# Patient Record
Sex: Female | Born: 1960 | Race: White | Hispanic: No | Marital: Married | Smoking: Former smoker
Health system: Southern US, Community
[De-identification: ages and names within clinical notes are randomized; demographics above are authoritative.]

## PROBLEM LIST (undated history)

## (undated) DIAGNOSIS — I499 Cardiac arrhythmia, unspecified: Secondary | ICD-10-CM

## (undated) DIAGNOSIS — U071 COVID-19: Secondary | ICD-10-CM

## (undated) DIAGNOSIS — T7840XA Allergy, unspecified, initial encounter: Secondary | ICD-10-CM

## (undated) DIAGNOSIS — K219 Gastro-esophageal reflux disease without esophagitis: Secondary | ICD-10-CM

## (undated) DIAGNOSIS — I441 Atrioventricular block, second degree: Secondary | ICD-10-CM

## (undated) DIAGNOSIS — E785 Hyperlipidemia, unspecified: Secondary | ICD-10-CM

## (undated) DIAGNOSIS — I495 Sick sinus syndrome: Secondary | ICD-10-CM

## (undated) DIAGNOSIS — H019 Unspecified inflammation of eyelid: Secondary | ICD-10-CM

## (undated) DIAGNOSIS — R5383 Other fatigue: Secondary | ICD-10-CM

## (undated) DIAGNOSIS — F419 Anxiety disorder, unspecified: Secondary | ICD-10-CM

## (undated) DIAGNOSIS — E538 Deficiency of other specified B group vitamins: Secondary | ICD-10-CM

## (undated) HISTORY — DX: Gastro-esophageal reflux disease without esophagitis: K21.9

## (undated) HISTORY — DX: Other fatigue: R53.83

## (undated) HISTORY — DX: Anxiety disorder, unspecified: F41.9

## (undated) HISTORY — DX: Unspecified inflammation of eyelid: H01.9

## (undated) HISTORY — DX: Hyperlipidemia, unspecified: E78.5

## (undated) HISTORY — DX: Deficiency of other specified B group vitamins: E53.8

## (undated) HISTORY — DX: Cardiac arrhythmia, unspecified: I49.9

## (undated) HISTORY — DX: Allergy, unspecified, initial encounter: T78.40XA

## (undated) HISTORY — DX: Sick sinus syndrome: I49.5

## (undated) HISTORY — DX: COVID-19: U07.1

## (undated) HISTORY — DX: Atrioventricular block, second degree: I44.1

---

## 2001-07-27 HISTORY — PX: CHOLECYSTECTOMY: SHX55

## 2003-07-28 HISTORY — PX: ABDOMINAL HYSTERECTOMY: SHX81

## 2004-06-24 ENCOUNTER — Inpatient Hospital Stay: Payer: Self-pay | Admitting: Obstetrics & Gynecology

## 2006-06-03 ENCOUNTER — Ambulatory Visit: Payer: Self-pay | Admitting: Obstetrics and Gynecology

## 2006-06-07 ENCOUNTER — Ambulatory Visit: Payer: Self-pay | Admitting: Gastroenterology

## 2007-08-09 ENCOUNTER — Ambulatory Visit: Payer: Self-pay

## 2009-03-15 ENCOUNTER — Ambulatory Visit: Payer: Self-pay

## 2010-08-26 ENCOUNTER — Ambulatory Visit: Payer: Self-pay | Admitting: General Practice

## 2010-10-14 ENCOUNTER — Ambulatory Visit: Payer: Self-pay | Admitting: Surgery

## 2011-03-01 ENCOUNTER — Emergency Department: Payer: Self-pay | Admitting: Internal Medicine

## 2011-03-03 ENCOUNTER — Other Ambulatory Visit: Payer: Self-pay | Admitting: Family Medicine

## 2011-08-21 IMAGING — CT CT CERVICAL SPINE WITHOUT CONTRAST
1 series · 12 of 14 positions shown, 15 images · non-contrast
Comparison: none

REASON FOR EXAM: neck pain
COMMENTS:

PROCEDURE:     CT  - CT CERVICAL SPINE WO  - March 01, 2011 [DATE]
RESULT:     Comparison: None.
TECHNIQUE: Multiple axial CT images were obtained of the cervical spine,
without intravenous contrast.  Sagittal and coronal reformatted images were
constructed.

[Series 6: axial · axial · 0.33mm/px · z∈[-658,-504]mm · 12 of 91 slices shown, 15 images]
[im 7/91  soft-tissue]
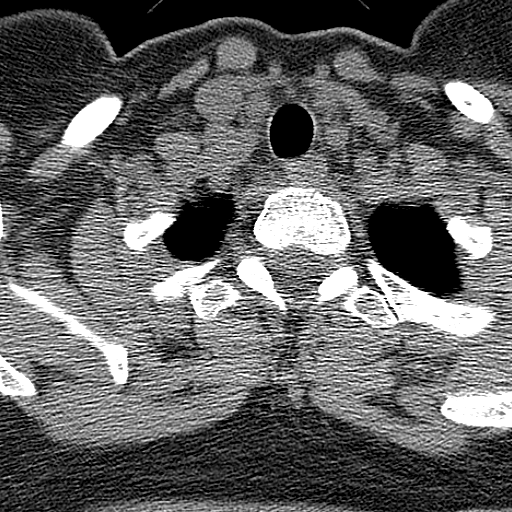
[im 7/91  bone]
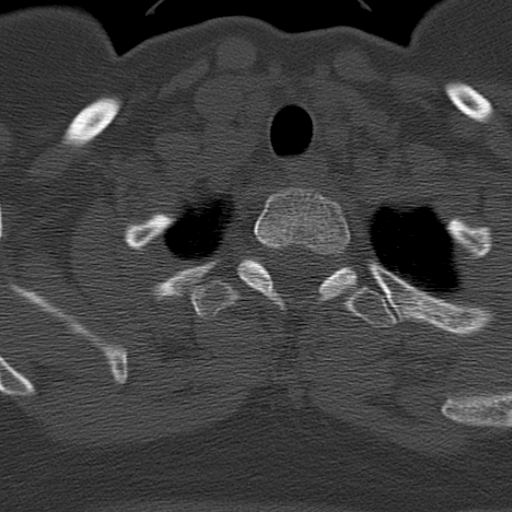
[im 14/91  bone]
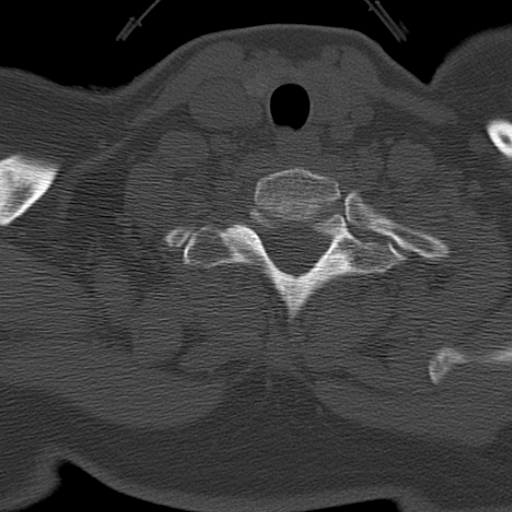
[im 21/91  bone]
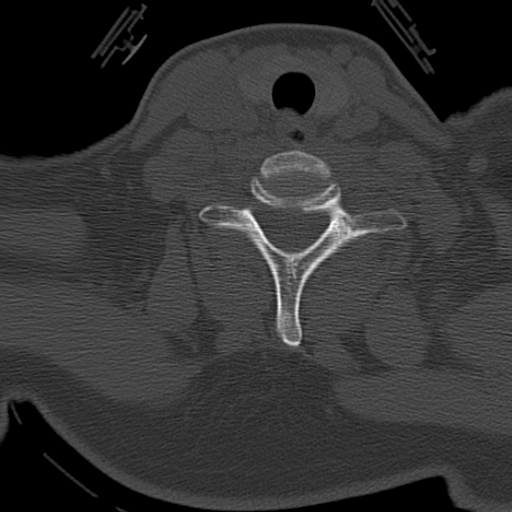
[im 28/91  bone]
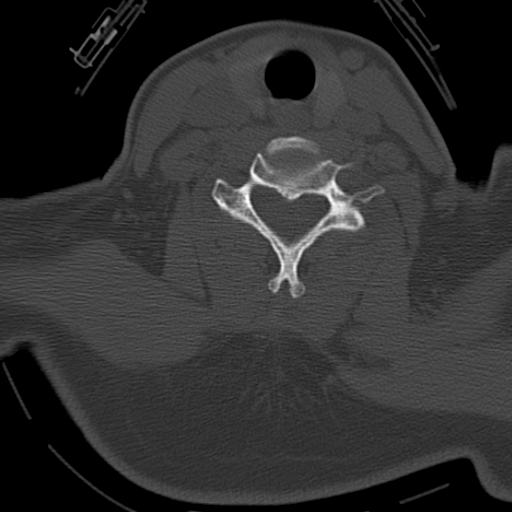
[im 35/91  soft-tissue]
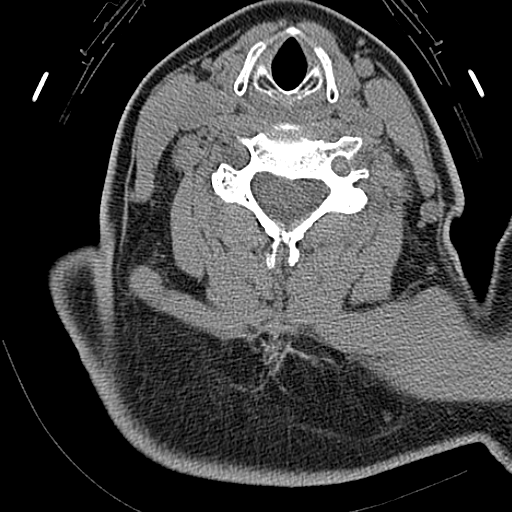
[im 35/91  bone]
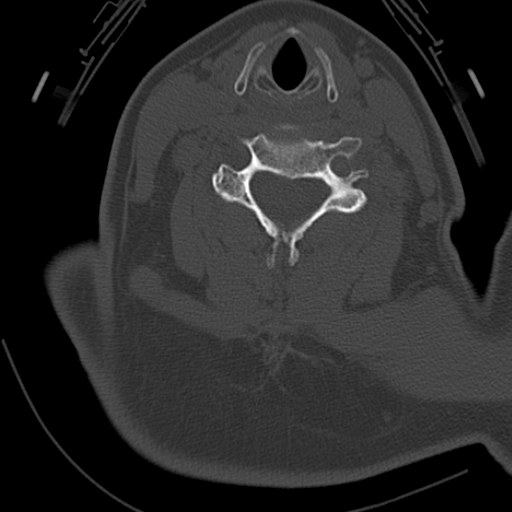
[im 42/91  bone]
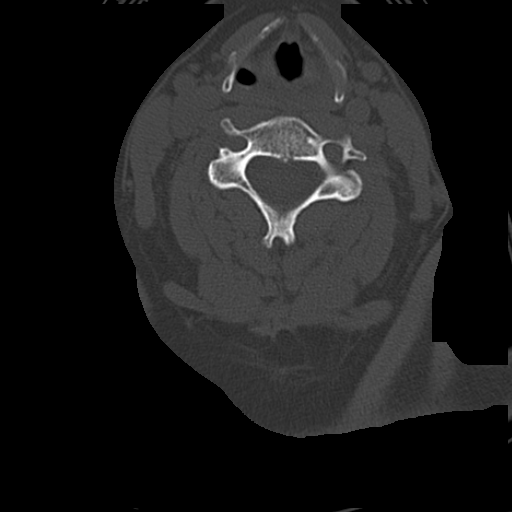
[im 49/91  bone]
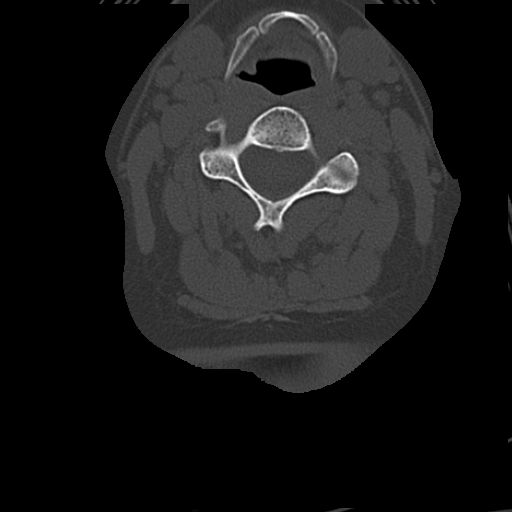
[im 56/91  bone]
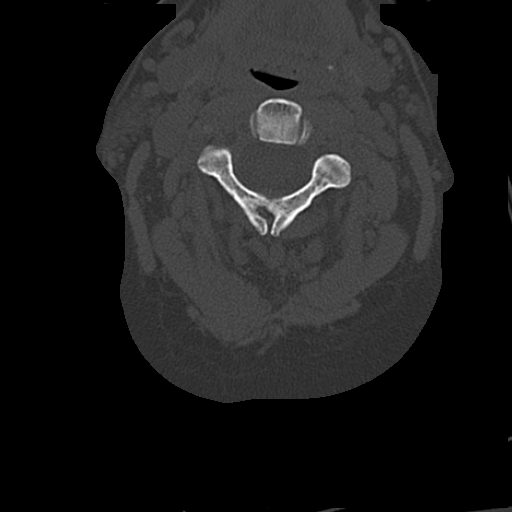
[im 63/91  soft-tissue]
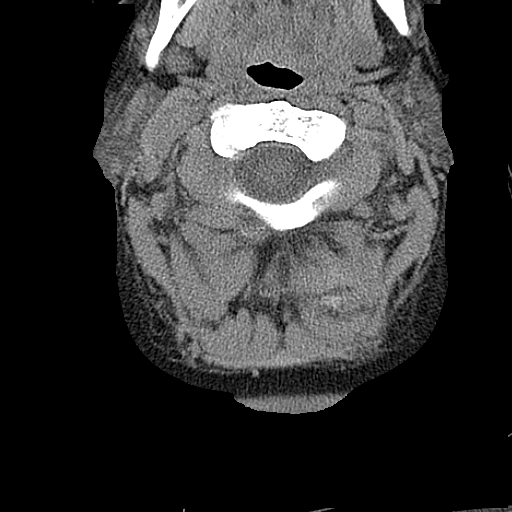
[im 63/91  bone]
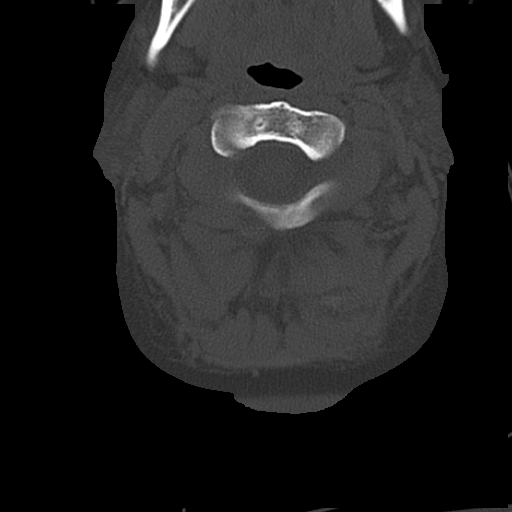
[im 70/91  bone]
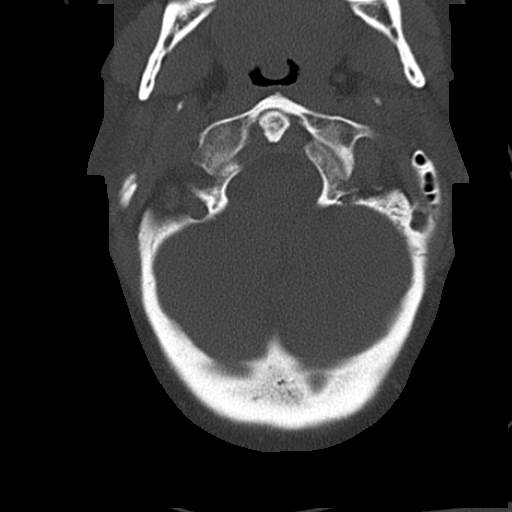
[im 77/91  bone]
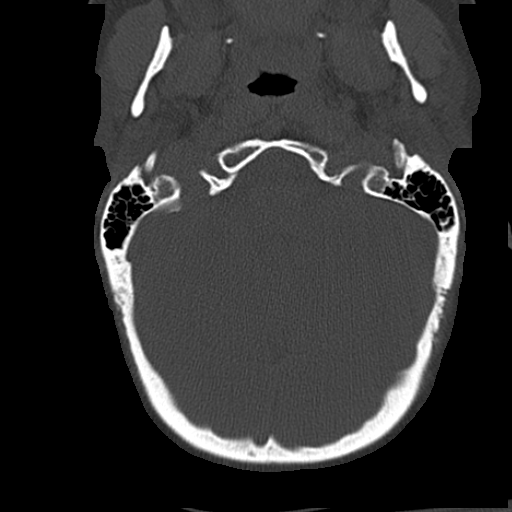
[im 84/91  bone]
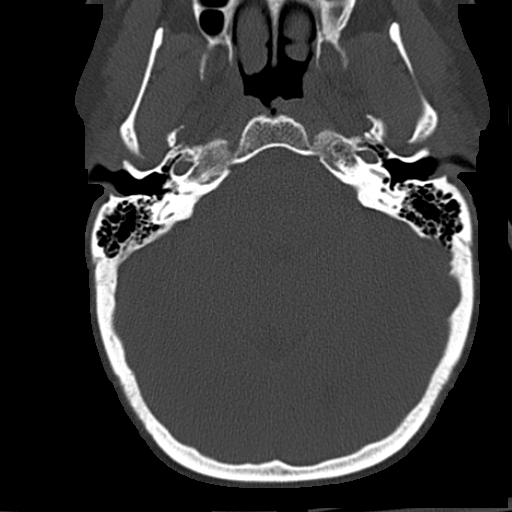

[12 of 14 positions shown; findings below may reference images not displayed]

FINDINGS: No evidence of cervical spine fracture or static listhesis.  Vertebral body
heights are maintained.  Prevertebral soft tissues are without normal
limits. Mild posterior disc osteophyte seen at C5-C6.

The left lobe the thyroid is heterogeneous. There appears to be an area of
low-attenuation that measure approximately 1.2 x 1.0 cm in the inferior left
thyroid lobe.
IMPRESSION: 1. No cervical spine fracture or static listhesis.  Ligamentous injury
cannot be excluded.
2. Findings which may represent a small nodule in the left lobe of the
thyroid, which is nonspecific. Further evaluation could be provided with
nonemergent thyroid ultrasound, as indicated.

## 2011-09-25 ENCOUNTER — Ambulatory Visit: Payer: Self-pay | Admitting: Cardiology

## 2011-10-05 ENCOUNTER — Ambulatory Visit: Payer: Self-pay | Admitting: Cardiology

## 2011-10-05 LAB — BASIC METABOLIC PANEL
Anion Gap: 9 (ref 7–16)
Calcium, Total: 9.8 mg/dL (ref 8.5–10.1)
Co2: 27 mmol/L (ref 21–32)
Creatinine: 0.67 mg/dL (ref 0.60–1.30)
EGFR (African American): >60
EGFR (Non-African Amer.): >60
Glucose: 85 mg/dL (ref 65–99)
Sodium: 141 mmol/L (ref 136–145)

## 2011-10-05 LAB — PROTIME-INR
INR: 0.9
Prothrombin Time: 12.5 secs (ref 11.5–14.7)

## 2011-10-05 LAB — CBC WITH DIFFERENTIAL/PLATELET
Basophil #: 0 10*3/uL (ref 0.0–0.1)
Eosinophil #: 0.1 10*3/uL (ref 0.0–0.7)
Eosinophil %: 1.5 %
HGB: 12.8 g/dL (ref 12.0–16.0)
MCH: 29 pg (ref 26.0–34.0)
MCHC: 33.6 g/dL (ref 32.0–36.0)
MCV: 86 fL (ref 80–100)
Monocyte #: 0.7 10*3/uL (ref 0.0–0.7)
Neutrophil %: 51.4 %
Platelet: 252 10*3/uL (ref 150–440)
RDW: 12.5 % (ref 11.5–14.5)

## 2011-10-05 LAB — APTT: Activated PTT: 27.7 secs (ref 23.6–35.9)

## 2011-10-07 ENCOUNTER — Ambulatory Visit: Payer: Self-pay | Admitting: Cardiology

## 2011-10-07 HISTORY — PX: PACEMAKER INSERTION: SHX728

## 2012-02-04 ENCOUNTER — Ambulatory Visit: Payer: Self-pay | Admitting: Surgery

## 2012-04-20 ENCOUNTER — Encounter: Payer: Self-pay | Admitting: Family Medicine

## 2012-04-20 ENCOUNTER — Ambulatory Visit (INDEPENDENT_AMBULATORY_CARE_PROVIDER_SITE_OTHER): Payer: PRIVATE HEALTH INSURANCE | Admitting: Family Medicine

## 2012-04-20 VITALS — BP 100/70 | HR 68 | Temp 97.6°F | Ht 65.25 in | Wt 213.0 lb

## 2012-04-20 DIAGNOSIS — Z0000 Encounter for general adult medical examination without abnormal findings: Secondary | ICD-10-CM

## 2012-04-20 DIAGNOSIS — Z808 Family history of malignant neoplasm of other organs or systems: Secondary | ICD-10-CM

## 2012-04-20 DIAGNOSIS — Z136 Encounter for screening for cardiovascular disorders: Secondary | ICD-10-CM

## 2012-04-20 DIAGNOSIS — I441 Atrioventricular block, second degree: Secondary | ICD-10-CM | POA: Insufficient documentation

## 2012-04-20 DIAGNOSIS — Z8349 Family history of other endocrine, nutritional and metabolic diseases: Secondary | ICD-10-CM

## 2012-04-20 LAB — LIPID PANEL
Cholesterol: 175 mg/dL (ref 0–200)
HDL: 49.6 mg/dL (ref >39.00)
Total CHOL/HDL Ratio: 4
Triglycerides: 98 mg/dL (ref 0.0–149.0)

## 2012-04-20 LAB — TSH: TSH: 1.02 u[IU]/mL (ref 0.35–5.50)

## 2012-04-20 LAB — COMPREHENSIVE METABOLIC PANEL
AST: 17 U/L (ref 0–37)
Albumin: 3.9 g/dL (ref 3.5–5.2)
BUN: 20 mg/dL (ref 6–23)
CO2: 29 mEq/L (ref 19–32)
Calcium: 9.6 mg/dL (ref 8.4–10.5)
Chloride: 104 mEq/L (ref 96–112)
Creatinine, Ser: 0.7 mg/dL (ref 0.4–1.2)
GFR: 90.35 mL/min (ref >60.00)
Glucose, Bld: 84 mg/dL (ref 70–99)
Potassium: 4.5 mEq/L (ref 3.5–5.1)

## 2012-04-20 NOTE — Progress Notes (Signed)
Subjective:    Patient ID: Erica Cline, female    DOB: Aug 13, 1960, 51 y.o.   MRN: [id number]  HPI  Very pleasant 51 yo female here to establish care.  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  Had pacer placed earlier this year and doing well. Symptoms have improved. Followed by Dr. Darrold Junker.  ?Latex allergy- had a hysterectomy in 2005.  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 Latex. No anaphylaxis, sob or wheezing.  Has never had a colonoscopy.  Due for lab work.  Does want her thyroid checked due to FH.  Patient Active Problem List  Diagnosis  . Mobitz type 2 second degree heart block   Past Medical History  Diagnosis Date  . Mobitz type 2 second degree heart block     Sees Dr. Darrold Junker, pacer placed in 2013.   Past Surgical History  Procedure Date  . Abdominal hysterectomy 2005  . Cholecystectomy 2003   History  Substance Use Topics  . Smoking status: Former Games developer  . Smokeless tobacco: Not on file  . Alcohol Use: Not on file   No family history on file. Allergies  Allergen Reactions  . Latex Hives   No current outpatient prescriptions on file prior to visit.   The PMH, PSH, Social History, Family History, Medications, and allergies have been reviewed in [facility], and have been updated if relevant.   Review of Systems See HPI Patient reports no  vision/ hearing changes,anorexia, weight change, fever ,adenopathy, persistant / recurrent hoarseness, swallowing issues, chest pain, edema,persistant / recurrent cough, hemoptysis, dyspnea(rest, exertional, paroxysmal nocturnal), gastrointestinal  bleeding (melena, rectal bleeding), abdominal pain, excessive heart burn, GU symptoms(dysuria, hematuria, pyuria, voiding/incontinence  Issues) syncope, focal weakness, severe memory loss, concerning skin lesions, depression, anxiety, abnormal  bruising/bleeding, major joint swelling, breast masses or abnormal vaginal bleeding.       Objective:   Physical Exam BP 100/70  Pulse 68  Temp 97.6 F (36.4 C)  Ht 5' 5.25" (1.657 m)  Wt 213 lb (96.616 kg)  BMI 35.17 kg/m2  General:  Well-developed,well-nourished,in no acute distress; alert,appropriate and cooperative throughout examination Head:  normocephalic and atraumatic.   Eyes:  vision grossly intact, pupils equal, pupils round, and pupils reactive to light.   Ears:  R ear normal and L ear normal.   Nose:  no external deformity.   Mouth:  good dentition.   Lungs:  Normal respiratory effort, chest expands symmetrically. Lungs are clear to auscultation, no crackles or wheezes. Heart:  Normal rate and regular rhythm. S1 and S2 normal without gallop, murmur, click, rub or other extra sounds. Msk:  No deformity or scoliosis noted of thoracic or lumbar spine.   Extremities:  No clubbing, cyanosis, edema, or deformity noted with normal full range of motion of all joints.   Neurologic:  alert & oriented X3 and gait normal.   Skin:  Intact without suspicious lesions or rashes Psych:  Cognition and judgment appear intact. Alert and cooperative with normal attention span and concentration. No apparent delusions, illusions, hallucinations     Assessment & Plan:   1. Screening for ischemic heart disease  Lipid Panel  2. Routine general medical examination at a health care facility  Reviewed preventive care protocols, scheduled due services, and updated immunizations Discussed nutrition, exercise, diet, and healthy lifestyle.  Comprehensive metabolic panel She would like to defer colonoscopy- will call back ina  Few months.  We will order latex free flu shots -discussed with Jilda. Pt to call back in a few weeks to see if we have received them.  3. Family history of thyroid disease  TSH, T4, Free  4. Mobitz type 2 second degree heart block  S/p pacer.  Doing well- asymptomatic.

## 2012-04-20 NOTE — Patient Instructions (Addendum)
It was great to meet you. We will call you with your lab results.  Please call me in a few months to set up your colonoscopy.  Good luck with your new job.

## 2012-04-22 ENCOUNTER — Telehealth: Payer: Self-pay

## 2012-04-22 NOTE — Telephone Encounter (Signed)
Patient notified as instructed by telephone. Pt scheduled flu vaccine(latex free) 04/29/12.

## 2012-04-22 NOTE — Telephone Encounter (Signed)
Message copied by Patience Musca on Fri Apr 22, 2012  2:15 PM ------      Message from: Dianne Dun      Created: Fri Apr 22, 2012 10:03 AM       Please call pt to let her know that we now have the latex free flu shot.      Thank you.

## 2012-04-29 ENCOUNTER — Ambulatory Visit (INDEPENDENT_AMBULATORY_CARE_PROVIDER_SITE_OTHER): Payer: PRIVATE HEALTH INSURANCE

## 2012-04-29 DIAGNOSIS — Z23 Encounter for immunization: Secondary | ICD-10-CM

## 2012-07-26 IMAGING — US US BREAST BILAT
1 series · 14 of 19 positions shown · non-contrast
Comparison: none

REASON FOR EXAM: RT BRST CYST 8 TO 9 OCLOCK AND AXILLARY PAIN AND YRLY
CAT 2
COMMENTS:

PROCEDURE:     US  - US BREAST BILATERAL  - February 04, 2012 [DATE]
RESULT:     No cystic or solid abnormalities are identified. A negative
mammogram and ultrasound does not preclude biopsy if a palpable lesion is
present.

[Series 1: us breast bilat · 0.12mm/px · 14 of 19 slices shown]
[im 1/19]
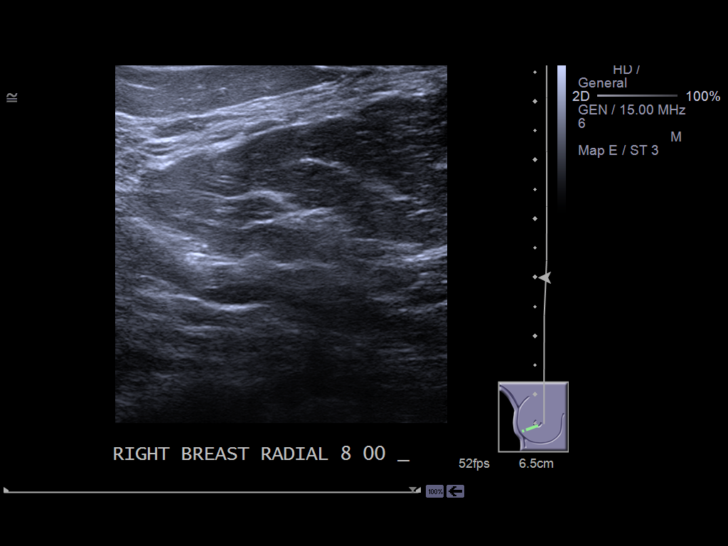
[im 3/19]
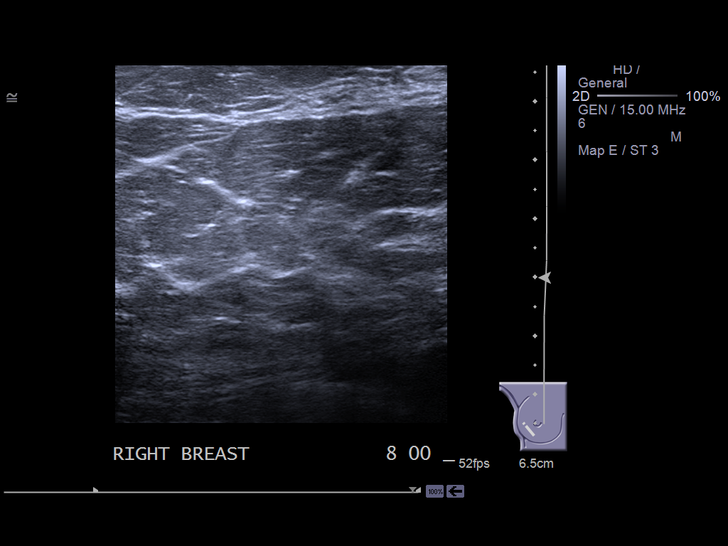
[im 4/19]
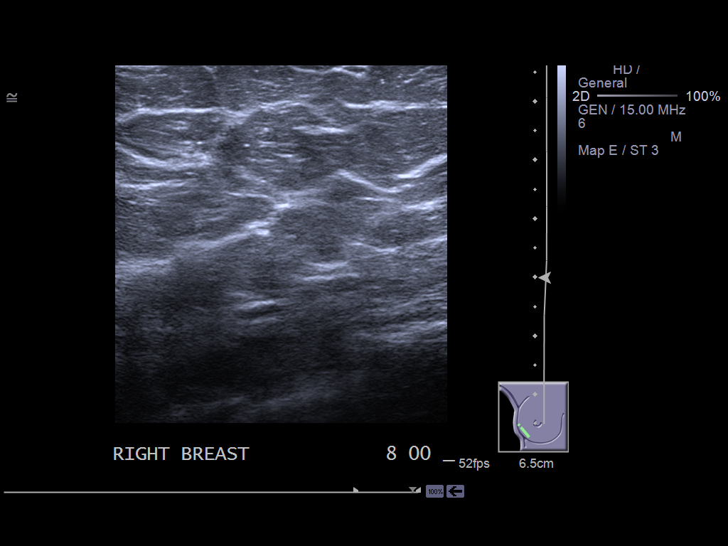
[im 5/19]
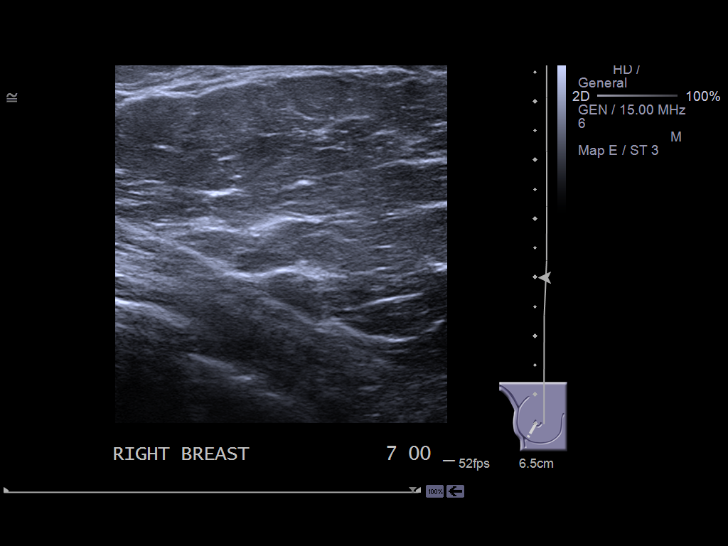
[im 7/19]
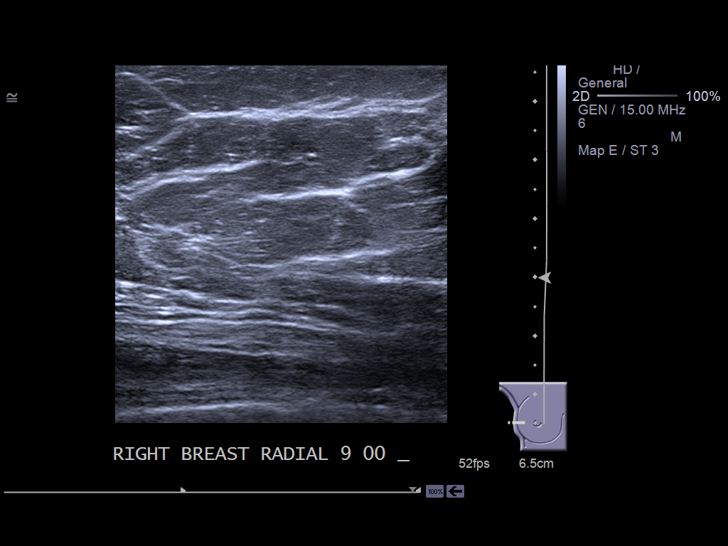
[im 8/19]
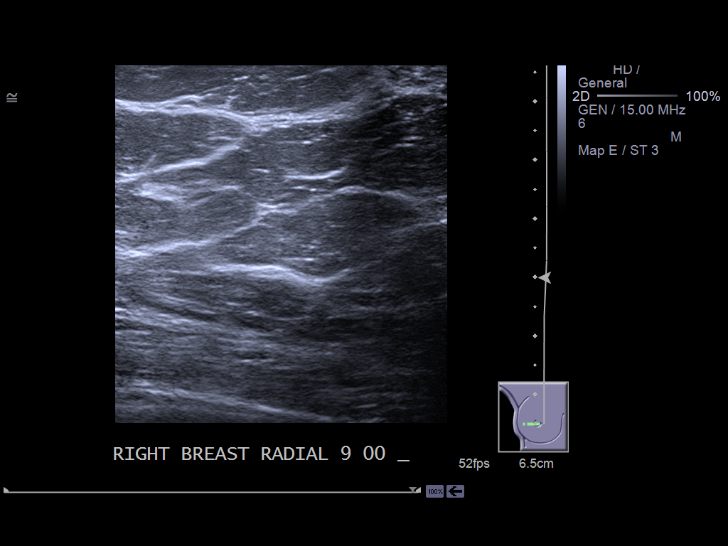
[im 9/19]
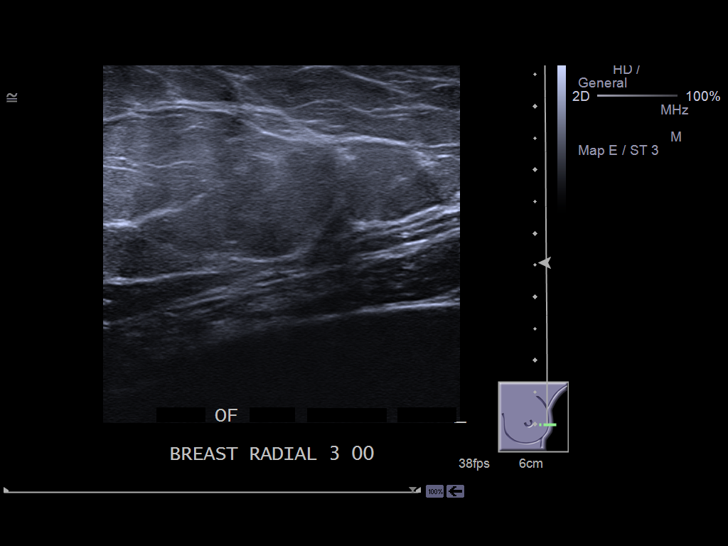
[im 11/19]
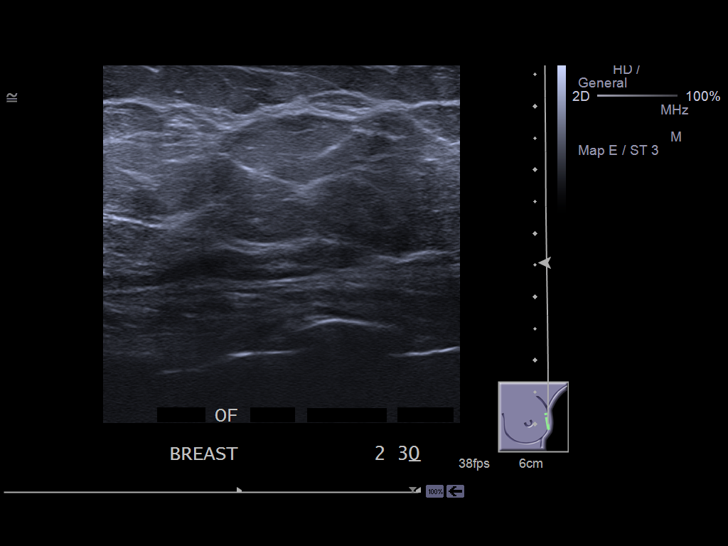
[im 12/19]
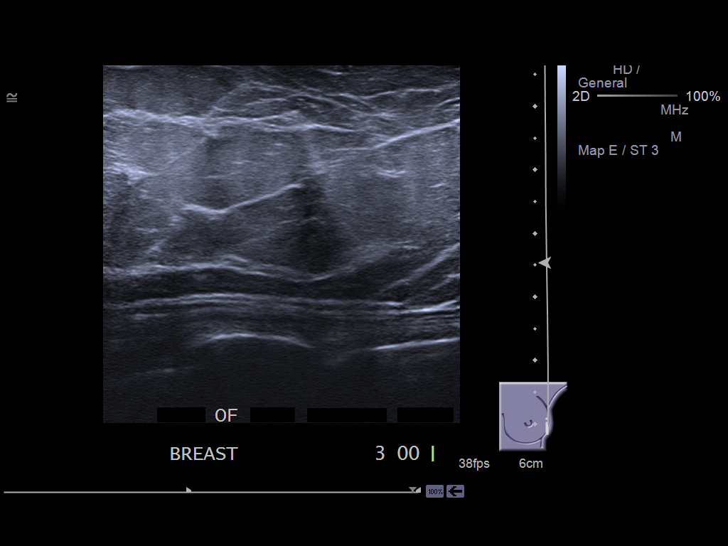
[im 13/19]
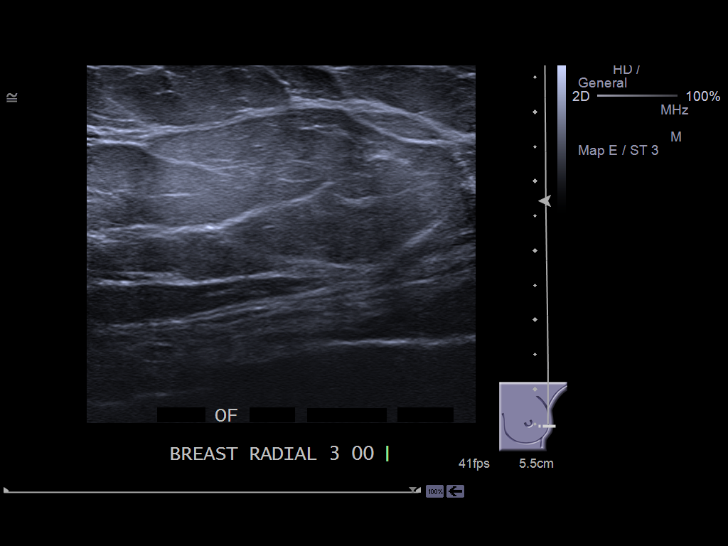
[im 15/19]
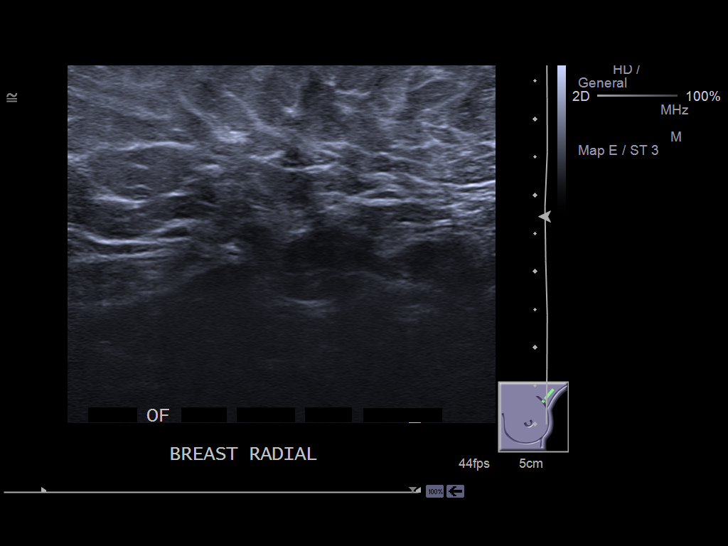
[im 16/19]
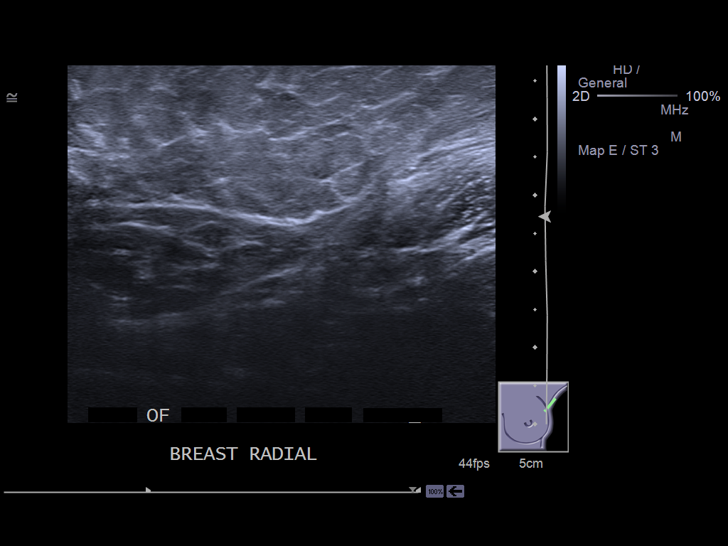
[im 17/19]
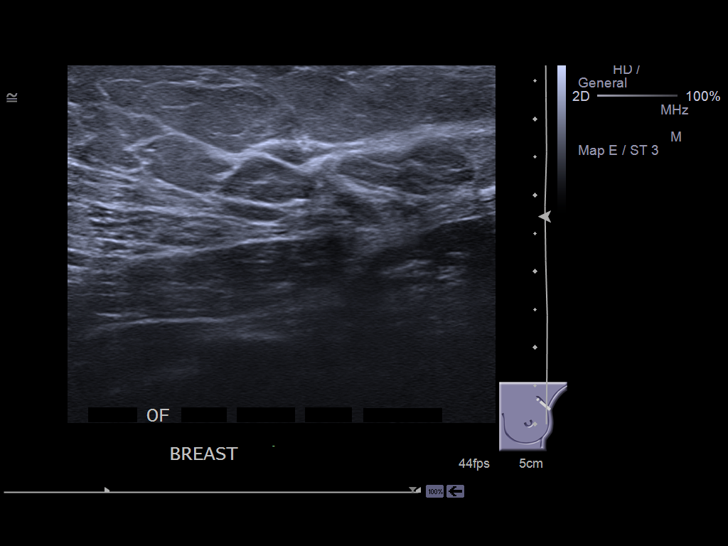
[im 19/19]
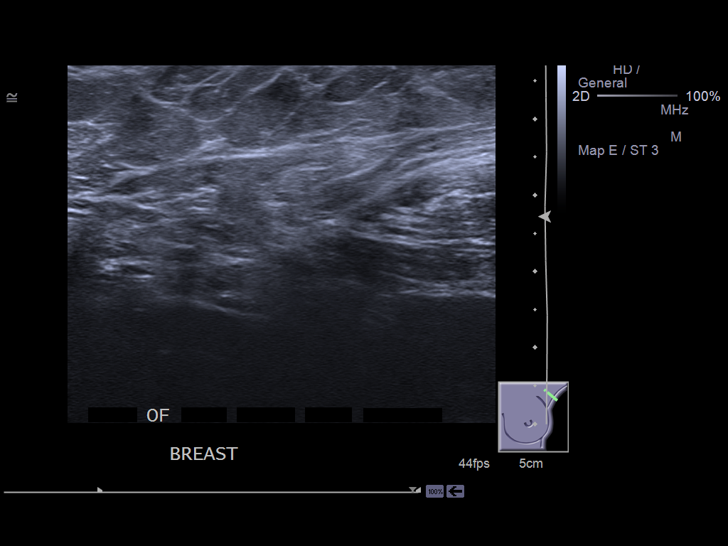

[14 of 19 positions shown; findings below may reference images not displayed]

IMPRESSION: BI-RADS: Category 2 - Benign Finding.

## 2013-02-14 ENCOUNTER — Ambulatory Visit: Payer: Self-pay | Admitting: Family Medicine

## 2013-02-15 ENCOUNTER — Encounter: Payer: Self-pay | Admitting: Family Medicine

## 2013-02-16 ENCOUNTER — Encounter: Payer: Self-pay | Admitting: *Deleted

## 2013-02-16 ENCOUNTER — Encounter: Payer: Self-pay | Admitting: Family Medicine

## 2013-02-16 LAB — HM MAMMOGRAPHY: HM Mammogram: NORMAL

## 2013-02-24 ENCOUNTER — Ambulatory Visit (INDEPENDENT_AMBULATORY_CARE_PROVIDER_SITE_OTHER): Payer: 59 | Admitting: Family Medicine

## 2013-02-24 ENCOUNTER — Encounter: Payer: Self-pay | Admitting: Family Medicine

## 2013-02-24 VITALS — BP 90/62 | HR 68 | Temp 97.9°F | Ht 65.25 in | Wt 214.0 lb

## 2013-02-24 DIAGNOSIS — F411 Generalized anxiety disorder: Secondary | ICD-10-CM

## 2013-02-24 DIAGNOSIS — F4323 Adjustment disorder with mixed anxiety and depressed mood: Secondary | ICD-10-CM | POA: Insufficient documentation

## 2013-02-24 LAB — CBC WITH DIFFERENTIAL/PLATELET
Hemoglobin: 13.5 g/dL (ref 12.0–15.0)
Lymphocytes Relative: 38 % (ref 12–46)
Lymphs Abs: 2.7 10*3/uL (ref 0.7–4.0)
Monocytes Relative: 10 % (ref 3–12)
Neutro Abs: 3.5 10*3/uL (ref 1.7–7.7)
Neutrophils Relative %: 50 % (ref 43–77)
Platelets: 296 10*3/uL (ref 150–400)
RBC: 4.74 MIL/uL (ref 3.87–5.11)
WBC: 7.1 10*3/uL (ref 4.0–10.5)

## 2013-02-24 NOTE — Patient Instructions (Addendum)
Great to see you. We will call you with your lab results and you can view them online.  

## 2013-02-24 NOTE — Progress Notes (Signed)
  Subjective:    Patient ID: Erica Cline, female    DOB: Jan 03, 1961, 52 y.o.   MRN: [id number]  HPI  Very pleasant 52 yo female here with several complaints- concerned she is menopausal.  S/p hysterectomy approx 10 years ago for fibroids.  Ovaries remain.  Past two weeks, increase emotional lability, tearfulness and insomnia. Does have hot flashes. Also having some panic attacks. No current SI or HI but has had SI in past.  When she has panic attack, does have SOB and CP.  Saw her cardiologist, Dr. Saundra Shelling 2 weeks ago and was told everything is fine.  Patient Active Problem List   Diagnosis Date Noted  . Adjustment reaction with anxiety and depression 02/24/2013  . Mobitz type 2 second degree heart block    Past Medical History  Diagnosis Date  . Mobitz type 2 second degree heart block     Sees Dr. Darrold Junker, pacer placed in 2013.   Past Surgical History  Procedure Laterality Date  . Abdominal hysterectomy  2005  . Cholecystectomy  2003   History  Substance Use Topics  . Smoking status: Former Games developer  . Smokeless tobacco: Not on file  . Alcohol Use: Not on file   No family history on file. Allergies  Allergen Reactions  . Latex Hives   No current outpatient prescriptions on file prior to visit.   No current facility-administered medications on file prior to visit.   The PMH, PSH, Social History, Family History, Medications, and allergies have been reviewed in [facility], and have been updated if relevant.   Review of Systems    See HPI Objective:   Physical Exam  BP 90/62  Pulse 68  Temp(Src) 97.9 F (36.6 C)  Ht 5' 5.25" (1.657 m)  Wt 214 lb (97.07 kg)  BMI 35.35 kg/m2  General:  Well-developed,well-nourished,in no acute distress; alert,appropriate and cooperative throughout examination Head:  normocephalic and atraumatic.   Psych:  Cognition and judgment appear intact. Alert and cooperative with normal attention span and concentration. No apparent  delusions, illusions, hallucinations    Assessment & Plan:  1. Anxiety state, unspecified >25 min spent with face to face with patient, >50% counseling and/or coordinating care Anxiety/depression vs menopausal symptoms. Discussed work up and treatment options.  Check labs today.  She is willing to start antidepressant once labs returned- wants to avoid weight gain and sexual dysfunction if at all possible.   - CBC with Differential - Comprehensive metabolic panel - TSH - T4, Free - Luteinizing hormone - Follicle Stimulating Hormone - Vitamin B12

## 2013-02-25 LAB — COMPREHENSIVE METABOLIC PANEL
ALT: 15 U/L (ref 0–35)
Albumin: 4.3 g/dL (ref 3.5–5.2)
CO2: 27 mEq/L (ref 19–32)
Glucose, Bld: 97 mg/dL (ref 70–99)
Potassium: 4.4 mEq/L (ref 3.5–5.3)
Sodium: 140 mEq/L (ref 135–145)
Total Bilirubin: 1.1 mg/dL (ref 0.3–1.2)
Total Protein: 6.5 g/dL (ref 6.0–8.3)

## 2013-02-25 LAB — TSH: TSH: 1.31 u[IU]/mL (ref 0.350–4.500)

## 2013-02-25 LAB — T4, FREE: Free T4: 1.22 ng/dL (ref 0.80–1.80)

## 2013-02-25 LAB — VITAMIN B12: Vitamin B-12: 336 pg/mL (ref 211–911)

## 2013-02-27 ENCOUNTER — Telehealth: Payer: Self-pay | Admitting: *Deleted

## 2013-02-27 MED ORDER — CITALOPRAM HYDROBROMIDE 20 MG PO TABS: ORAL_TABLET | ORAL | Status: DC

## 2013-02-27 NOTE — Telephone Encounter (Signed)
Spoke with patient today regarding her lab results.  She states she would like to start and antidepressant, rather than estrogen.  She is asking for something that will not cause her to gain weight.  Please advise, used [facility] in [place].

## 2013-02-27 NOTE — Telephone Encounter (Signed)
As discussed before, we cannot guarantee that she will not gain weight but I will send rx for celexa.  I have had many post menopausal females pleased with this medication and did not cause weight gain but it is always possible.  Please update me in 2-3 weeks.

## 2013-02-28 NOTE — Telephone Encounter (Signed)
Left message asking patient to call back

## 2013-02-28 NOTE — Telephone Encounter (Signed)
Advised patient as instructed. 

## 2013-03-07 ENCOUNTER — Telehealth: Payer: Self-pay

## 2013-03-07 NOTE — Telephone Encounter (Signed)
That type of reaction is common at first with that medication but usually goes away. She can try taking a half twice a day instead as that may reduce the sensation If it doesn't go away at all, she should probably reduce the dose to just 1/2 a day (but I doubt that will be necessary)

## 2013-03-07 NOTE — Telephone Encounter (Signed)
Advised patient as instructed.  She will call back if she continues to have problems.

## 2013-03-07 NOTE — Telephone Encounter (Signed)
Pt recently started Celexa 20 mg; pt presently taking one tab daily; one hour after taking Celexa gets jittery feeling for 1 hour and then jittery feeling goes away. Pt not sure if should have jittery feeling after taking med. Pt still having panic attacks 3-4 times daily. Pt said over all she does feel better but concerned about jittery feeling.Please advise. [employer]. Pt request cb.

## 2013-03-09 MED ORDER — ESTROGENS CONJUGATED 0.3 MG PO TABS: 0.3000 mg | ORAL_TABLET | Freq: Every day | ORAL | Status: DC

## 2013-03-09 NOTE — Telephone Encounter (Signed)
Yes.  Ok to stop celexa.  Start premarin- rx sent in.

## 2013-03-09 NOTE — Addendum Note (Signed)
Addended by: Dianne Dun on: 03/09/2013 12:52 PM   Modules accepted: Orders, Medications

## 2013-03-09 NOTE — Telephone Encounter (Signed)
Advised patient as instructed.  She will call back if any problems or questions.

## 2013-03-09 NOTE — Telephone Encounter (Signed)
Pt left v/m pt taking 1/2 tab in morning and 1/2 at night; still having heart palpitations and jittery feeling with 1/2 tab, Pt wants to know if can stop Celexa and try estrogen.Please advise.

## 2013-05-22 ENCOUNTER — Ambulatory Visit (INDEPENDENT_AMBULATORY_CARE_PROVIDER_SITE_OTHER): Payer: 59

## 2013-05-22 DIAGNOSIS — Z23 Encounter for immunization: Secondary | ICD-10-CM

## 2013-06-09 ENCOUNTER — Other Ambulatory Visit: Payer: Self-pay | Admitting: Family Medicine

## 2013-06-09 NOTE — Telephone Encounter (Signed)
Last office visit 02/24/2013.  Celexa is not listed on medication list.  Ok to refill?

## 2013-06-19 ENCOUNTER — Encounter: Payer: Self-pay | Admitting: Gastroenterology

## 2013-06-19 ENCOUNTER — Ambulatory Visit (INDEPENDENT_AMBULATORY_CARE_PROVIDER_SITE_OTHER): Payer: 59 | Admitting: Internal Medicine

## 2013-06-19 ENCOUNTER — Encounter: Payer: Self-pay | Admitting: Internal Medicine

## 2013-06-19 VITALS — BP 100/60 | HR 78 | Temp 98.0°F | Ht 65.25 in | Wt 217.5 lb

## 2013-06-19 DIAGNOSIS — Z780 Asymptomatic menopausal state: Secondary | ICD-10-CM

## 2013-06-19 DIAGNOSIS — Z0000 Encounter for general adult medical examination without abnormal findings: Secondary | ICD-10-CM

## 2013-06-19 DIAGNOSIS — Z1211 Encounter for screening for malignant neoplasm of colon: Secondary | ICD-10-CM

## 2013-06-19 NOTE — Addendum Note (Signed)
Addended by: Lorre Munroe on: 06/19/2013 03:50 PM   Modules accepted: Orders

## 2013-06-19 NOTE — Progress Notes (Signed)
Pre-visit discussion using our clinic review tool. No additional management support is needed unless otherwise documented below in the visit note.  

## 2013-06-19 NOTE — Patient Instructions (Signed)

## 2013-06-19 NOTE — Progress Notes (Signed)
Subjective:    Patient ID: Erica Cline, female    DOB: 04-08-1961, 52 y.o.   MRN: [id number]  HPI  Pt presents to the clinic today for her annual exam. She has no concerns today.  Flu: 05/22/13 Tetanus: Within the last 5 years Pap Smear: about 10 years ago Mammogram: 2014 Bone Density: never Colonoscopy: never Eye Doctor: as needed Dentist: biannually  Review of Systems      Past Medical History  Diagnosis Date  . Mobitz type 2 second degree heart block     Sees Dr. Darrold Junker, pacer placed in 2013.    Current Outpatient Prescriptions  Medication Sig Dispense Refill  . citalopram (CELEXA) 20 MG tablet TAKE 1/2 TAB BY MOUTH DAILY X 3 DAYS, THEN INCREASE TO 1 TAB DAILY THEREAFTER.  30 tablet  1  . estrogens, conjugated, (PREMARIN) 0.3 MG tablet Take 1 tablet (0.3 mg total) by mouth daily. Take daily for 21 days then do not take for 7 days.  30 tablet  1   No current facility-administered medications for this visit.    Allergies  Allergen Reactions  . Latex Hives    No family history on file.  History   Social History  . Marital Status: Married    Spouse Name: N/A    Number of Children: N/A  . Years of Education: N/A   Occupational History  . Not on file.   Social History Main Topics  . Smoking status: Former Games developer  . Smokeless tobacco: Not on file  . Alcohol Use: Not on file  . Drug Use: Not on file  . Sexual Activity: Not on file   Other Topics Concern  . 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   3 children, married.   Mother is Babs Sciara.     Constitutional: Denies fever, malaise, fatigue, headache or abrupt weight changes.  HEENT: Denies eye pain, eye redness, ear pain, ringing in the ears, wax buildup, runny nose, nasal congestion, bloody nose, or sore throat. Respiratory: Denies difficulty breathing, shortness of breath, cough or sputum production.   Cardiovascular: Denies chest pain, chest  tightness, palpitations or swelling in the hands or feet.  Gastrointestinal: Denies abdominal pain, bloating, constipation, diarrhea or blood in the stool.  GU: Denies urgency, frequency, pain with urination, burning sensation, blood in urine, odor or discharge. Musculoskeletal: Denies decrease in range of motion, difficulty with gait, muscle pain or joint pain and swelling.  Skin: Denies redness, rashes, lesions or ulcercations.  Neurological: Denies dizziness, difficulty with memory, difficulty with speech or problems with balance and coordination.   No other specific complaints in a complete review of systems (except as listed in HPI above).  Objective:   Physical Exam   BP 100/60  Pulse 78  Temp(Src) 98 F (36.7 C) (Oral)  Ht 5' 5.25" (1.657 m)  Wt 217 lb 8 oz (98.657 kg)  BMI 35.93 kg/m2  SpO2 98% Wt Readings from Last 3 Encounters:  06/19/13 217 lb 8 oz (98.657 kg)  02/24/13 214 lb (97.07 kg)  04/20/12 213 lb (96.616 kg)    General: Appears her stated age, well developed, well nourished in NAD. Skin: Warm, dry and intact. No rashes, lesions or ulcerations noted. HEENT: Head: normal shape and size; Eyes: sclera white, no icterus, conjunctiva pink, PERRLA and EOMs intact; Ears: Tm's gray and intact, normal light reflex; Nose: mucosa pink and moist, septum midline; Throat/Mouth: Teeth present, mucosa pink and moist, no  exudate, lesions or ulcerations noted.  Neck: Normal range of motion. Neck supple, trachea midline. No massses, lumps or thyromegaly present.  Cardiovascular: Normal rate and rhythm. S1,S2 noted.  No murmur, rubs or gallops noted. No JVD or BLE edema. No carotid bruits noted. Pulmonary/Chest: Normal effort and positive vesicular breath sounds. No respiratory distress. No wheezes, rales or ronchi noted.  Abdomen: Soft and nontender. Normal bowel sounds, no bruits noted. No distention or masses noted. Liver, spleen and kidneys non palpable. Pelvic: Adnexa non  palpable, normal vaginal discharge. Musculoskeletal: Normal range of motion. No signs of joint swelling. No difficulty with gait.  Neurological: Alert and oriented. Cranial nerves II-XII intact. Coordination normal. +DTRs bilaterally. Psychiatric: Mood and affect normal. Behavior is normal. Judgment and thought content normal.   EKG:  BMET    Component Value Date/Time   NA 140 02/24/2013 1512   K 4.4 02/24/2013 1512   CL 105 02/24/2013 1512   CO2 27 02/24/2013 1512   GLUCOSE 97 02/24/2013 1512   BUN 18 02/24/2013 1512   CREATININE 0.76 02/24/2013 1512   CREATININE 0.7 04/20/2012 1126   CALCIUM 10.1 02/24/2013 1512    Lipid Panel     Component Value Date/Time   CHOL 175 04/20/2012 1126   TRIG 98.0 04/20/2012 1126   HDL 49.60 04/20/2012 1126   CHOLHDL 4 04/20/2012 1126   VLDL 19.6 04/20/2012 1126   LDLCALC 106* 04/20/2012 1126    CBC    Component Value Date/Time   WBC 7.1 02/24/2013 1512   RBC 4.74 02/24/2013 1512   HGB 13.5 02/24/2013 1512   HCT 40.4 02/24/2013 1512   PLT 296 02/24/2013 1512   MCV 85.2 02/24/2013 1512   MCH 28.5 02/24/2013 1512   MCHC 33.4 02/24/2013 1512   RDW 13.2 02/24/2013 1512   LYMPHSABS 2.7 02/24/2013 1512   MONOABS 0.7 02/24/2013 1512   EOSABS 0.1 02/24/2013 1512   BASOSABS 0.0 02/24/2013 1512    Hgb A1C No results found for this basename: HGBA1C         Assessment & Plan:   Preventative Health Maintenance:  Will order your bone density and colon screening today All other HM UTD Lab reviewed and normal Pelvic exam done  RTC in 1 year or sooner if needed

## 2013-08-01 ENCOUNTER — Ambulatory Visit: Payer: Self-pay | Admitting: Internal Medicine

## 2013-08-02 ENCOUNTER — Encounter: Payer: Self-pay | Admitting: Internal Medicine

## 2013-08-11 ENCOUNTER — Ambulatory Visit (AMBULATORY_SURGERY_CENTER): Payer: 59

## 2013-08-11 VITALS — Ht 66.0 in | Wt 213.0 lb

## 2013-08-11 DIAGNOSIS — Z800 Family history of malignant neoplasm of digestive organs: Secondary | ICD-10-CM

## 2013-08-11 MED ORDER — MOVIPREP 100 G PO SOLR
1.0000 | Freq: Once | ORAL | Status: DC
Start: 2013-08-11 — End: 2013-08-18

## 2013-08-17 ENCOUNTER — Encounter: Payer: Self-pay | Admitting: Gastroenterology

## 2013-08-18 ENCOUNTER — Encounter: Payer: Self-pay | Admitting: Gastroenterology

## 2013-08-18 ENCOUNTER — Ambulatory Visit (AMBULATORY_SURGERY_CENTER): Payer: 59 | Admitting: Gastroenterology

## 2013-08-18 VITALS — BP 108/66 | HR 60 | Temp 97.4°F | Resp 13 | Ht 66.0 in | Wt 213.0 lb

## 2013-08-18 DIAGNOSIS — D126 Benign neoplasm of colon, unspecified: Secondary | ICD-10-CM

## 2013-08-18 DIAGNOSIS — Z1211 Encounter for screening for malignant neoplasm of colon: Secondary | ICD-10-CM

## 2013-08-18 DIAGNOSIS — Z800 Family history of malignant neoplasm of digestive organs: Secondary | ICD-10-CM

## 2013-08-18 MED ORDER — SODIUM CHLORIDE 0.9 % IV SOLN: 500.0000 mL | INTRAVENOUS | Status: DC

## 2013-08-18 NOTE — Patient Instructions (Signed)
YOU HAD AN ENDOSCOPIC PROCEDURE TODAY AT THE [facility]: Refer to the procedure report that was given to you for any specific questions about what was found during the examination.  If the procedure report does not answer your questions, please call your gastroenterologist to clarify.  If you requested that your care partner not be given the details of your procedure findings, then the procedure report has been included in a sealed envelope for you to review at your convenience later.  YOU SHOULD EXPECT: Some feelings of bloating in the abdomen. Passage of more gas than usual.  Walking can help get rid of the air that was put into your GI tract during the procedure and reduce the bloating. If you had a lower endoscopy (such as a colonoscopy or flexible sigmoidoscopy) you may notice spotting of blood in your stool or on the toilet paper. If you underwent a bowel prep for your procedure, then you may not have a normal bowel movement for a few days.  DIET: Your first meal following the procedure should be a light meal and then it is ok to progress to your normal diet.  A half-sandwich or bowl of soup is an example of a good first meal.  Heavy or fried foods are harder to digest and may make you feel nauseous or bloated.  Likewise meals heavy in dairy and vegetables can cause extra gas to form and this can also increase the bloating.  Drink plenty of fluids but you should avoid alcoholic beverages for 24 hours.  ACTIVITY: Your care partner should take you home directly after the procedure.  You should plan to take it easy, moving slowly for the rest of the day.  You can resume normal activity the day after the procedure however you should NOT DRIVE or use heavy machinery for 24 hours (because of the sedation medicines used during the test).    SYMPTOMS TO REPORT IMMEDIATELY: A gastroenterologist can be reached at any hour.  During normal business hours, 8:30 AM to 5:00 PM Monday through Friday,  call [phone].  After hours and on weekends, please call the GI answering service at [phone] who will take a message and have the physician on call contact you.   Following lower endoscopy (colonoscopy or flexible sigmoidoscopy):  Excessive amounts of blood in the stool  Significant tenderness or worsening of abdominal pains  Swelling of the abdomen that is new, acute  Fever of 100F or higher    FOLLOW UP: If any biopsies were taken you will be contacted by phone or by letter within the next 1-3 weeks.  Call your gastroenterologist if you have not heard about the biopsies in 3 weeks.  Our staff will call the home number listed on your records the next business day following your procedure to check on you and address any questions or concerns that you may have at that time regarding the information given to you following your procedure. This is a courtesy call and so if there is no answer at the home number and we have not heard from you through the emergency physician on call, we will assume that you have returned to your regular daily activities without incident.  SIGNATURES/CONFIDENTIALITY: You and/or your care partner have signed paperwork which will be entered into your electronic medical record.  These signatures attest to the fact that that the information above on your After Visit Summary has been reviewed and is understood.  Full responsibility of the confidentiality   of this discharge information lies with you and/or your care-partner.     

## 2013-08-18 NOTE — Op Note (Signed)
[place]  [address] [place], [id number]   COLONOSCOPY PROCEDURE REPORT  PATIENT: Erica Cline, Erica Cline  MR#: [id number] BIRTHDATE: 14-Mar-1961 , 55  yrs. old GENDER: Female ENDOSCOPIST: Milus Banister, MD REFERRED XT:[id number] Aron, M.D. PROCEDURE DATE:  08/18/2013 PROCEDURE:   Colonoscopy with snare polypectomy First Screening Colonoscopy - Avg.  risk and is 50 yrs.  old or older Yes.  Prior Negative Screening - Now for repeat screening. N/A  History of Adenoma - Now for follow-up colonoscopy & has been > or = to 3 yrs.  N/A  Polyps Removed Today? Yes. ASA CLASS:   Class II INDICATIONS:average risk screening. MEDICATIONS: Fentanyl 100 mcg IV, Versed 10 mg IV, and These medications were titrated to patient response per physician's verbal order  DESCRIPTION OF PROCEDURE:   After the risks benefits and alternatives of the procedure were thoroughly explained, informed consent was obtained.  A digital rectal exam revealed no abnormalities of the rectum.   The [id number]  endoscope was introduced through the anus and advanced to the cecum, which was identified by both the appendix and ileocecal valve. No adverse events experienced.   The quality of the prep was excellent.  The instrument was then slowly withdrawn as the colon was fully examined.  COLON FINDINGS: Two polyps were found, removed and sent to pathology.  These were both sessile, 4-71mm across, located in ascending and descending segment, removed with cold snare.  The examination was otherwise normal.  Retroflexed views revealed no abnormalities. The time to cecum=3 minutes 51 seconds.  Withdrawal time=7 minutes 23 seconds.  The scope was withdrawn and the procedure completed. COMPLICATIONS: There were no complications.  ENDOSCOPIC IMPRESSION: Two polyps were found, removed and sent to pathology. The examination was otherwise normal.  RECOMMENDATIONS: If the polyp(s) removed today are proven to  be adenomatous (pre-cancerous) polyps, you will need a repeat colonoscopy in 5 years.  Otherwise you should continue to follow colorectal cancer screening guidelines for "routine risk" patients with colonoscopy in 10 years.  You will receive a letter within 1-2 weeks with the results of your biopsy as well as final recommendations.  Please call my office if you have not received a letter after 3 weeks.   eSigned:  Milus Banister, MD 08/18/2013 11:29 AM

## 2013-08-21 ENCOUNTER — Telehealth: Payer: Self-pay | Admitting: *Deleted

## 2013-08-21 NOTE — Telephone Encounter (Signed)
  Follow up Call-  Call back number 08/18/2013  Post procedure Call Back phone  # [phone]  Permission to leave phone message Yes     Patient questions:  Do you have a fever, pain , or abdominal swelling? no Pain Score  0 *  Have you tolerated food without any problems? yes  Have you been able to return to your normal activities? yes  Do you have any questions about your discharge instructions: Diet   no Medications  no Follow up visit  no  Do you have questions or concerns about your Care? no  Actions: * If pain score is 4 or above: No action needed, pain <4.

## 2013-08-24 ENCOUNTER — Encounter: Payer: Self-pay | Admitting: Gastroenterology

## 2013-09-06 ENCOUNTER — Other Ambulatory Visit: Payer: Self-pay

## 2013-09-06 MED ORDER — CITALOPRAM HYDROBROMIDE 20 MG PO TABS: ORAL_TABLET | ORAL | Status: DC

## 2013-09-06 NOTE — Telephone Encounter (Signed)
Last prescribed 06/09/13 with 1 refill--last OV 06/19/13 for annual--please advise

## 2014-01-30 DIAGNOSIS — R55 Syncope and collapse: Secondary | ICD-10-CM | POA: Insufficient documentation

## 2014-01-30 DIAGNOSIS — R002 Palpitations: Secondary | ICD-10-CM | POA: Insufficient documentation

## 2014-01-30 DIAGNOSIS — I341 Nonrheumatic mitral (valve) prolapse: Secondary | ICD-10-CM | POA: Insufficient documentation

## 2014-01-30 DIAGNOSIS — Z950 Presence of cardiac pacemaker: Secondary | ICD-10-CM | POA: Insufficient documentation

## 2014-05-11 ENCOUNTER — Telehealth: Payer: Self-pay | Admitting: Family Medicine

## 2014-05-11 NOTE — Telephone Encounter (Signed)
Husband dropped of form that needs to be filled out for insurance purposes. Will put in inbox Thanks [employer]

## 2014-05-14 DIAGNOSIS — Z7689 Persons encountering health services in other specified circumstances: Secondary | ICD-10-CM

## 2014-05-14 NOTE — Telephone Encounter (Signed)
Form completed and given to Dr Deborra Medina to complete charge form. Pt advised per Vincente Liberty that Wellness form was completed based on labs in 2013 and 2014. Pt has not had recent labs and refused OV before CPE in 06/2014

## 2014-05-17 ENCOUNTER — Ambulatory Visit (INDEPENDENT_AMBULATORY_CARE_PROVIDER_SITE_OTHER): Payer: 59

## 2014-05-17 DIAGNOSIS — Z23 Encounter for immunization: Secondary | ICD-10-CM

## 2014-06-26 ENCOUNTER — Encounter: Payer: 59 | Admitting: Family Medicine

## 2014-11-18 NOTE — Op Note (Signed)
PATIENT NAME:  Erica Cline, Erica Cline MR#:  [id number] DATE OF BIRTH:  [birth year]  DATE OF PROCEDURE:  10/07/2011  PREPROCEDURE DIAGNOSIS: Type II second degree AV block.   PROCEDURE: Dual chamber pacemaker implantation.   POSTPROCEDURE DIAGNOSIS: Atrial sensing with ventricular pacing.   ATTENDING: Isaias Cowman, MD   INDICATION: The patient is a 54 year old female who has had recent history of lightheadedness, dizziness, and palpitations. 24-hour Holter monitor was performed which revealed episodes of type II second degree AV block with associated bradycardia during waking hours. Procedure, risks, benefits, and alternatives of permanent pacemaker implantation were explained to the patient and informed written consent was obtained.   DESCRIPTION OF PROCEDURE: She was brought to the operating room in a fasting state. The left pectoral region was prepped and draped in the usual sterile manner. Anesthesia was obtained with 1% Xylocaine locally. A 6 cm incision was performed over the left pectoral region. The pacemaker pocket was generated by electrocautery and blunt dissection. Access was obtained to the left subclavian vein by fine needle aspiration. Medtronic ventricular and atrial leads were positioned in the right ventricular apex and right atrial appendage under fluoroscopic guidance. After proper thresholds were obtained, the leads were sutured in place. The pacemaker pocket was irrigated with gentamicin solution. The leads were connected to a dual chamber rate responsive pacemaker generator and positioned in the pocket. The pocket was closed with 2-0 and 4-0 Vicryl, respectively. Steri-Strips and pressure dressing were applied.  ____________________________ Isaias Cowman, MD ap:drc D: 10/07/2011 13:55:00 ET T: 10/07/2011 15:48:49 ET JOB#: [id number]  cc: Isaias Cowman, MD, <Dictator> Isaias Cowman MD ELECTRONICALLY SIGNED 10/16/2011 8:49

## 2017-12-08 DIAGNOSIS — R4589 Other symptoms and signs involving emotional state: Secondary | ICD-10-CM | POA: Insufficient documentation

## 2017-12-08 DIAGNOSIS — E663 Overweight: Secondary | ICD-10-CM | POA: Insufficient documentation

## 2017-12-08 DIAGNOSIS — L608 Other nail disorders: Secondary | ICD-10-CM | POA: Insufficient documentation

## 2017-12-08 DIAGNOSIS — K635 Polyp of colon: Secondary | ICD-10-CM | POA: Insufficient documentation

## 2017-12-08 DIAGNOSIS — M8580 Other specified disorders of bone density and structure, unspecified site: Secondary | ICD-10-CM | POA: Insufficient documentation

## 2017-12-08 DIAGNOSIS — E538 Deficiency of other specified B group vitamins: Secondary | ICD-10-CM | POA: Insufficient documentation

## 2018-06-10 DIAGNOSIS — R3129 Other microscopic hematuria: Secondary | ICD-10-CM | POA: Insufficient documentation

## 2018-07-31 ENCOUNTER — Encounter: Payer: Self-pay | Admitting: Gastroenterology

## 2018-12-09 DIAGNOSIS — R454 Irritability and anger: Secondary | ICD-10-CM | POA: Insufficient documentation

## 2018-12-09 DIAGNOSIS — Z780 Asymptomatic menopausal state: Secondary | ICD-10-CM | POA: Insufficient documentation

## 2019-06-19 DIAGNOSIS — E669 Obesity, unspecified: Secondary | ICD-10-CM | POA: Insufficient documentation

## 2019-06-19 DIAGNOSIS — R109 Unspecified abdominal pain: Secondary | ICD-10-CM | POA: Insufficient documentation

## 2019-09-25 DIAGNOSIS — K648 Other hemorrhoids: Secondary | ICD-10-CM | POA: Insufficient documentation

## 2020-04-25 DIAGNOSIS — Z950 Presence of cardiac pacemaker: Secondary | ICD-10-CM | POA: Diagnosis not present

## 2020-04-26 DIAGNOSIS — Z950 Presence of cardiac pacemaker: Secondary | ICD-10-CM | POA: Diagnosis not present

## 2020-04-29 DIAGNOSIS — H019 Unspecified inflammation of eyelid: Secondary | ICD-10-CM | POA: Diagnosis not present

## 2020-05-03 DIAGNOSIS — R059 Cough, unspecified: Secondary | ICD-10-CM | POA: Diagnosis not present

## 2020-05-03 DIAGNOSIS — Z20822 Contact with and (suspected) exposure to covid-19: Secondary | ICD-10-CM | POA: Diagnosis not present

## 2020-06-14 DIAGNOSIS — E663 Overweight: Secondary | ICD-10-CM | POA: Diagnosis not present

## 2020-06-14 DIAGNOSIS — E538 Deficiency of other specified B group vitamins: Secondary | ICD-10-CM | POA: Diagnosis not present

## 2020-06-14 DIAGNOSIS — Z950 Presence of cardiac pacemaker: Secondary | ICD-10-CM | POA: Diagnosis not present

## 2020-06-25 DIAGNOSIS — Z6835 Body mass index (BMI) 35.0-35.9, adult: Secondary | ICD-10-CM | POA: Diagnosis not present

## 2020-06-25 DIAGNOSIS — E538 Deficiency of other specified B group vitamins: Secondary | ICD-10-CM | POA: Diagnosis not present

## 2020-06-25 DIAGNOSIS — Z1231 Encounter for screening mammogram for malignant neoplasm of breast: Secondary | ICD-10-CM | POA: Diagnosis not present

## 2020-06-25 DIAGNOSIS — Z23 Encounter for immunization: Secondary | ICD-10-CM | POA: Diagnosis not present

## 2020-06-25 DIAGNOSIS — Z0000 Encounter for general adult medical examination without abnormal findings: Secondary | ICD-10-CM | POA: Diagnosis not present

## 2020-08-08 DIAGNOSIS — Z20822 Contact with and (suspected) exposure to covid-19: Secondary | ICD-10-CM | POA: Diagnosis not present

## 2020-08-13 DIAGNOSIS — U071 COVID-19: Secondary | ICD-10-CM | POA: Insufficient documentation

## 2020-08-27 DIAGNOSIS — Z950 Presence of cardiac pacemaker: Secondary | ICD-10-CM | POA: Diagnosis not present

## 2020-08-28 DIAGNOSIS — Z950 Presence of cardiac pacemaker: Secondary | ICD-10-CM | POA: Diagnosis not present

## 2020-09-17 DIAGNOSIS — Z23 Encounter for immunization: Secondary | ICD-10-CM | POA: Diagnosis not present

## 2020-09-19 DIAGNOSIS — R509 Fever, unspecified: Secondary | ICD-10-CM | POA: Insufficient documentation

## 2020-11-08 DIAGNOSIS — W57XXXA Bitten or stung by nonvenomous insect and other nonvenomous arthropods, initial encounter: Secondary | ICD-10-CM | POA: Diagnosis not present

## 2020-11-08 DIAGNOSIS — S30861A Insect bite (nonvenomous) of abdominal wall, initial encounter: Secondary | ICD-10-CM | POA: Diagnosis not present

## 2020-12-18 DIAGNOSIS — L570 Actinic keratosis: Secondary | ICD-10-CM | POA: Diagnosis not present

## 2020-12-18 DIAGNOSIS — L821 Other seborrheic keratosis: Secondary | ICD-10-CM | POA: Diagnosis not present

## 2020-12-18 DIAGNOSIS — L814 Other melanin hyperpigmentation: Secondary | ICD-10-CM | POA: Diagnosis not present

## 2020-12-18 DIAGNOSIS — D229 Melanocytic nevi, unspecified: Secondary | ICD-10-CM | POA: Diagnosis not present

## 2021-01-31 DIAGNOSIS — D1801 Hemangioma of skin and subcutaneous tissue: Secondary | ICD-10-CM | POA: Diagnosis not present

## 2021-01-31 DIAGNOSIS — D492 Neoplasm of unspecified behavior of bone, soft tissue, and skin: Secondary | ICD-10-CM | POA: Diagnosis not present

## 2021-03-26 DIAGNOSIS — I341 Nonrheumatic mitral (valve) prolapse: Secondary | ICD-10-CM | POA: Diagnosis not present

## 2021-03-26 DIAGNOSIS — R002 Palpitations: Secondary | ICD-10-CM | POA: Diagnosis not present

## 2021-03-26 DIAGNOSIS — Z950 Presence of cardiac pacemaker: Secondary | ICD-10-CM | POA: Diagnosis not present

## 2021-03-26 DIAGNOSIS — Z7689 Persons encountering health services in other specified circumstances: Secondary | ICD-10-CM | POA: Diagnosis not present

## 2021-05-27 DIAGNOSIS — M25551 Pain in right hip: Secondary | ICD-10-CM | POA: Diagnosis not present

## 2021-05-27 DIAGNOSIS — M25562 Pain in left knee: Secondary | ICD-10-CM | POA: Diagnosis not present

## 2021-05-27 DIAGNOSIS — M25552 Pain in left hip: Secondary | ICD-10-CM | POA: Diagnosis not present

## 2021-05-27 DIAGNOSIS — M545 Low back pain, unspecified: Secondary | ICD-10-CM | POA: Diagnosis not present

## 2021-05-27 DIAGNOSIS — M25561 Pain in right knee: Secondary | ICD-10-CM | POA: Diagnosis not present

## 2021-07-09 ENCOUNTER — Encounter: Payer: Self-pay | Admitting: Student in an Organized Health Care Education/Training Program

## 2021-07-09 ENCOUNTER — Ambulatory Visit
Payer: BC Managed Care – PPO | Attending: Student in an Organized Health Care Education/Training Program | Admitting: Student in an Organized Health Care Education/Training Program

## 2021-07-09 ENCOUNTER — Other Ambulatory Visit: Payer: Self-pay

## 2021-07-09 VITALS — BP 119/71 | HR 87 | Temp 96.7°F | Resp 18 | Ht 66.0 in | Wt 201.0 lb

## 2021-07-09 DIAGNOSIS — G8929 Other chronic pain: Secondary | ICD-10-CM | POA: Diagnosis not present

## 2021-07-09 DIAGNOSIS — M1712 Unilateral primary osteoarthritis, left knee: Secondary | ICD-10-CM | POA: Diagnosis not present

## 2021-07-09 DIAGNOSIS — M5416 Radiculopathy, lumbar region: Secondary | ICD-10-CM | POA: Insufficient documentation

## 2021-07-09 DIAGNOSIS — M25562 Pain in left knee: Secondary | ICD-10-CM | POA: Diagnosis not present

## 2021-07-09 MED ORDER — GABAPENTIN 100 MG PO CAPS: 100.0000 mg | ORAL_CAPSULE | Freq: Every day | ORAL | 0 refills | Status: DC

## 2021-07-09 NOTE — Progress Notes (Signed)
Patient: Erica Cline  Service Category: E/M  Provider: Gillis Santa, MD  DOB: 1961-04-13  DOS: 07/09/2021  Referring Provider: Willaim Sheng, MD  MRN: [id number]  Setting: Ambulatory outpatient  PCP: No primary care provider on file.  Type: New Patient  Specialty: Interventional Pain Management    Location: Office  Delivery: Face-to-face     Primary Reason(s) for Visit: Encounter for initial evaluation of one or more chronic problems (new to examiner) potentially causing chronic pain, and posing a threat to normal musculoskeletal function. (Level of risk: High) CC: Hip Pain (right), Leg Pain (Right from thigh through to heel), and Knee Pain (left)  HPI  [name] is a 60 y.o. year old, female patient, who comes for the first time to our practice referred by Willaim Sheng, MD for our initial evaluation of her chronic pain. She has Mobitz type 2 second degree heart block; Adjustment reaction with anxiety and depression; Lumbar radicular pain (right L4/5); Chronic pain of left knee; and Primary osteoarthritis of left knee on their problem list. Today she comes in for evaluation of her Hip Pain (right), Leg Pain (Right from thigh through to heel), and Knee Pain (left)  Pain Assessment: Location: Right Hip Radiating: right leg to heel at times Onset: More than a month ago Duration: Chronic pain Quality: Aching (deep) Severity: 6 /10 (subjective, self-reported pain score)  Effect on ADL:   Timing: Intermittent Modifying factors: rest, NSAIDS, heat BP: 119/71   HR: 87  Onset and Duration: Gradual and Date of onset: 4-5 months Cause of pain: Unknown Severity: Getting worse, NAS-11 at its worse: 8/10, NAS-11 at its best: 0/10, NAS-11 now: 1/10, and NAS-11 on the average: 6/10 Timing: After activity or exercise Aggravating Factors: Climbing, Kneeling, Squatting, Twisting, Walking, Walking uphill, and Walking downhill Alleviating Factors: Stretching, Hot packs, Resting, and Warm  showers or baths Associated Problems: Night-time cramps and Pain that does not allow patient to sleep Quality of Pain: Aching, Intermittent, and Deep Previous Examinations or Tests: X-rays Previous Treatments: Physical Therapy, Steroid treatments by mouth, Strengthening exercises, Stretching exercises, and TENS  [name] is a pleasant 60 year old female who presents with a chief complaint of low back pain with radiation into her right leg in a dermatomal fashion.  She notes numbness tingling and pain radiating along her right lateral thigh down her right knee occasionally to her toes.  She has done physical therapy in the past and tries to do stretching exercises at home.  She has an x-ray that shows mild to moderate lumbar degenerative disc disease with small spinal listhesis at L5 and S1 with mild to space narrowing and osteophytosis and facet arthropathy.  Patient also has a history of bilateral knee osteoarthritis, left greater than right.  She is a candidate for knee replacement surgery but has been told to lose weight.  She has a pacemaker in place for arrhythmia.  She currently works at [employer].  Meds   Current Outpatient Medications:    gabapentin (NEURONTIN) 100 MG capsule, Take 1-3 capsules (100-300 mg total) by mouth at bedtime. Follow written titration schedule., Disp: 90 capsule, Rfl: 0   ibuprofen (ADVIL) 200 MG tablet, Take 600 mg by mouth as needed., Disp: , Rfl:    citalopram (CELEXA) 20 MG tablet, TAKE 1/2 TAB BY MOUTH DAILY X 3 DAYS, THEN INCREASE TO 1 TAB DAILY THEREAFTER. (Patient not taking: Reported on 07/09/2021), Disp: 30 tablet, Rfl: 1  Imaging Review  Lumbar spine x-ray: Mild to moderate lumbar  degenerative disc disease with small spondylolisthesis at L5 and S1 with mild to space narrowing with osteophytosis and facet arthropathy  Complexity Note: Imaging results reviewed. Results shared with Ms. Fiala, using Layman's terms.                         ROS  Cardiovascular:  Abnormal heart rhythm and Pacemaker or defibrillator Pulmonary or Respiratory: Snoring  Neurological: No reported neurological signs or symptoms such as seizures, abnormal skin sensations, urinary and/or fecal incontinence, being born with an abnormal open spine and/or a tethered spinal cord Psychological-Psychiatric: No reported psychological or psychiatric signs or symptoms such as difficulty sleeping, anxiety, depression, delusions or hallucinations (schizophrenial), mood swings (bipolar disorders) or suicidal ideations or attempts Gastrointestinal: No reported gastrointestinal signs or symptoms such as vomiting or evacuating blood, reflux, heartburn, alternating episodes of diarrhea and constipation, inflamed or scarred liver, or pancreas or irrregular and/or infrequent bowel movements Genitourinary: No reported renal or genitourinary signs or symptoms such as difficulty voiding or producing urine, peeing blood, non-functioning kidney, kidney stones, difficulty emptying the bladder, difficulty controlling the flow of urine, or chronic kidney disease Hematological: No reported hematological signs or symptoms such as prolonged bleeding, low or poor functioning platelets, bruising or bleeding easily, hereditary bleeding problems, low energy levels due to low hemoglobin or being anemic Endocrine: No reported endocrine signs or symptoms such as high or low blood sugar, rapid heart rate due to high thyroid levels, obesity or weight gain due to slow thyroid or thyroid disease Rheumatologic: No reported rheumatological signs and symptoms such as fatigue, joint pain, tenderness, swelling, redness, heat, stiffness, decreased range of motion, with or without associated rash Musculoskeletal: Negative for myasthenia gravis, muscular dystrophy, multiple sclerosis or malignant hyperthermia Work History: Working full time  Allergies  Ms. Nephew is allergic to latex.  Laboratory Chemistry Profile   Renal Lab  Results  Component Value Date   BUN 18 02/24/2013   CREATININE 0.76 02/24/2013   GFR 90.35 04/20/2012   GFRAA >60 10/05/2011   GFRNONAA >60 10/05/2011     Electrolytes Lab Results  Component Value Date   NA 140 02/24/2013   K 4.4 02/24/2013   CL 105 02/24/2013   CALCIUM 10.1 02/24/2013     Hepatic Lab Results  Component Value Date   AST 15 02/24/2013   ALT 15 02/24/2013   ALBUMIN 4.3 02/24/2013   ALKPHOS 96 02/24/2013     ID No results found for: LYMEIGGIGMAB, HIV, SARSCOV2NAA, STAPHAUREUS, MRSAPCR, HCVAB, PREGTESTUR, RMSFIGG, QFVRPH1IGG, QFVRPH2IGG, LYMEIGGIGMAB   Bone No results found for: Marveen Reeks, [id number], [id number], 25OHVITD1, 25OHVITD2, 25OHVITD3, TESTOFREE, TESTOSTERONE   Endocrine Lab Results  Component Value Date   GLUCOSE 97 02/24/2013   TSH 1.310 02/24/2013   FREET4 1.22 02/24/2013     Neuropathy Lab Results  Component Value Date   VITAMINB12 336 02/24/2013     CNS No results found for: COLORCSF, APPEARCSF, RBCCOUNTCSF, WBCCSF, POLYSCSF, LYMPHSCSF, EOSCSF, PROTEINCSF, GLUCCSF, JCVIRUS, CSFOLI, IGGCSF, LABACHR, ACETBL, LABACHR, ACETBL   Inflammation (CRP: Acute   ESR: Chronic) No results found for: CRP, ESRSEDRATE, LATICACIDVEN   Rheumatology No results found for: RF, ANA, LABURIC, URICUR, LYMEIGGIGMAB, LYMEABIGMQN, HLAB27   Coagulation Lab Results  Component Value Date   INR 0.9 10/05/2011   LABPROT 12.5 10/05/2011   APTT 27.7 10/05/2011   PLT 296 02/24/2013     Cardiovascular Lab Results  Component Value Date   HGB 13.5 02/24/2013   HCT 40.4 02/24/2013  Screening No results found for: SARSCOV2NAA, COVIDSOURCE, STAPHAUREUS, MRSAPCR, HCVAB, HIV, PREGTESTUR   Cancer No results found for: CEA, CA125, LABCA2   Allergens No results found for: ALMOND, APPLE, ASPARAGUS, AVOCADO, BANANA, BARLEY, BASIL, BAYLEAF, GREENBEAN, LIMABEAN, WHITEBEAN, BEEFIGE, REDBEET, BLUEBERRY, BROCCOLI, CABBAGE, MELON, CARROT, CASEIN, CASHEWNUT,  CAULIFLOWER, CELERY     Note: Lab results reviewed.  PFSH  Drug: Ms. Madkins  reports no history of drug use. Alcohol:  reports current alcohol use. Tobacco:  reports that she has quit smoking. She has never used smokeless tobacco. Medical:  has a past medical history of Mobitz type 2 second degree heart block. Family: family history includes Colon cancer in her maternal grandmother.  Past Surgical History:  Procedure Laterality Date   ABDOMINAL HYSTERECTOMY  2005   CHOLECYSTECTOMY  2003   PACEMAKER INSERTION  10-07-2011   Active Ambulatory Problems    Diagnosis Date Noted   Mobitz type 2 second degree heart block    Adjustment reaction with anxiety and depression 02/24/2013   Lumbar radicular pain (right L4/5) 07/09/2021   Chronic pain of left knee 07/09/2021   Primary osteoarthritis of left knee 07/09/2021   Resolved Ambulatory Problems    Diagnosis Date Noted   No Resolved Ambulatory Problems   No Additional Past Medical History   Constitutional Exam  General appearance: Well nourished, well developed, and well hydrated. In no apparent acute distress Vitals:   07/09/21 1308  BP: 119/71  Pulse: 87  Resp: 18  Temp: (!) 96.7 F (35.9 C)  TempSrc: Temporal  SpO2: 100%  Weight: 201 lb (91.2 kg)  Height: $Remove'5\' 6"'jVNyAov$  (1.676 m)   BMI Assessment: Estimated body mass index is 32.44 kg/m as calculated from the following:   Height as of this encounter: $RemoveBeforeD'5\' 6"'uNCltpCcOSXaSU$  (1.676 m).   Weight as of this encounter: 201 lb (91.2 kg).  BMI interpretation table: BMI level Category Range association with higher incidence of chronic pain  <18 kg/m2 Underweight   18.5-24.9 kg/m2 Ideal body weight   25-29.9 kg/m2 Overweight Increased incidence by 20%  30-34.9 kg/m2 Obese (Class I) Increased incidence by 68%  35-39.9 kg/m2 Severe obesity (Class II) Increased incidence by 136%  >40 kg/m2 Extreme obesity (Class III) Increased incidence by 254%   Patient's current BMI Ideal Body weight  Body mass  index is 32.44 kg/m. Ideal body weight: 59.3 kg (130 lb 11.7 oz) Adjusted ideal body weight: 72 kg (158 lb 13.4 oz)   BMI Readings from Last 4 Encounters:  07/09/21 32.44 kg/m  08/18/13 34.38 kg/m  08/11/13 34.38 kg/m  06/19/13 35.92 kg/m   Wt Readings from Last 4 Encounters:  07/09/21 201 lb (91.2 kg)  08/18/13 213 lb (96.6 kg)  08/11/13 213 lb (96.6 kg)  06/19/13 217 lb 8 oz (98.7 kg)    Psych/Mental status: Alert, oriented x 3 (person, place, & time)       Eyes: PERLA Respiratory: No evidence of acute respiratory distress  Lumbar Spine Area Exam  Skin & Axial Inspection: No masses, redness, or swelling Alignment: Symmetrical Functional ROM: Pain restricted ROM       Stability: No instability detected Muscle Tone/Strength: Functionally intact. No obvious neuro-muscular anomalies detected. Sensory (Neurological): Dermatomal pain pattern  Gait & Posture Assessment  Ambulation: Unassisted Gait: Relatively normal for age and body habitus Posture: WNL   Lower Extremity Exam    Side: Right lower extremity  Side: Left lower extremity  Stability: No instability observed  Stability: No instability observed          Skin & Extremity Inspection: Skin color, temperature, and hair growth are WNL. No peripheral edema or cyanosis. No masses, redness, swelling, asymmetry, or associated skin lesions. No contractures.  Skin & Extremity Inspection: Skin color, temperature, and hair growth are WNL. No peripheral edema or cyanosis. No masses, redness, swelling, asymmetry, or associated skin lesions. No contractures.  Functional ROM: Pain restricted ROM for hip and knee joints Limited SLR (straight leg raise)  Functional ROM: Pain restricted ROM for knee joint          Muscle Tone/Strength: Functionally intact. No obvious neuro-muscular anomalies detected.  Muscle Tone/Strength: Functionally intact. No obvious neuro-muscular anomalies detected.  Sensory (Neurological): Dermatomal  pain pattern        Sensory (Neurological): Arthropathic arthralgia        DTR: Patellar: deferred today Achilles: deferred today Plantar: deferred today  DTR: Patellar: deferred today Achilles: deferred today Plantar: deferred today  Palpation: No palpable anomalies  Palpation: No palpable anomalies    Assessment  Primary Diagnosis & Pertinent Problem List: The primary encounter diagnosis was Lumbar radicular pain (right L4/5). Diagnoses of Chronic pain of left knee and Primary osteoarthritis of left knee were also pertinent to this visit.  Visit Diagnosis (New problems to examiner): 1. Lumbar radicular pain (right L4/5)   2. Chronic pain of left knee   3. Primary osteoarthritis of left knee    Plan of Care (Initial workup plan)   Problem-specific plan: Lumbar radicular pain (right L4/5) Clinical symptoms and exam suggestive of right L4-L5 radiculopathy.  Discussed diagnostic right L4-L5 epidural steroid injection.  Risks and benefits reviewed and patient would like to proceed.  Future considerations include lumbar MRI if lumbar ESI not effective  Requested Prescriptions   Signed Prescriptions Disp Refills   gabapentin (NEURONTIN) 100 MG capsule 90 capsule 0    Sig: Take 1-3 capsules (100-300 mg total) by mouth at bedtime. Follow written titration schedule.     Chronic pain of left knee Discussed genicular nerve block for left knee pain.  Patient states that she will think about this further.   Procedure Orders         Lumbar Epidural Injection     Pharmacotherapy (current): Medications ordered:  Meds ordered this encounter  Medications   gabapentin (NEURONTIN) 100 MG capsule    Sig: Take 1-3 capsules (100-300 mg total) by mouth at bedtime. Follow written titration schedule.    Dispense:  90 capsule    Refill:  0    Fill one day early if pharmacy is closed on scheduled refill date. May substitute for generic if available.    Medications administered during this  visit: Livana C. Schwertner had no medications administered during this visit.   Future considerations: Transforaminal ESI, lumbar MRI, left knee genicular nerve block  Provider-requested follow-up: Return in about 5 days (around 07/14/2021) for Right L4/5 ESI , without sedation.  I spent a total of 60 minutes reviewing chart data, face-to-face evaluation with the patient, counseling and coordination of care as detailed above.   Future Appointments  Date Time Provider [place]  07/09/2021  2:00 PM Gillis Santa, MD [facility] None  07/14/2021  1:20 PM Gillis Santa, MD [facility] None    Note by: Gillis Santa, MD Date: 07/09/2021; Time: 1:59 PM

## 2021-07-09 NOTE — Assessment & Plan Note (Addendum)
Clinical symptoms and exam suggestive of right L4-L5 radiculopathy.  Discussed diagnostic right L4-L5 epidural steroid injection.  Risks and benefits reviewed and patient would like to proceed.  Future considerations include lumbar MRI if lumbar ESI not effective  Requested Prescriptions   Signed Prescriptions Disp Refills   gabapentin (NEURONTIN) 100 MG capsule 90 capsule 0    Sig: Take 1-3 capsules (100-300 mg total) by mouth at bedtime. Follow written titration schedule.

## 2021-07-09 NOTE — Addendum Note (Signed)
Addended by: Gillis Santa on: 07/09/2021 02:17 PM   Modules accepted: Orders

## 2021-07-09 NOTE — Patient Instructions (Signed)
____________________________________________________________________________________________  Preparing for your procedure (without sedation)  Procedure appointments are limited to planned procedures: . No Prescription Refills. . No disability issues will be discussed. . No medication changes will be discussed.  Instructions: . Oral Intake: Do not eat or drink anything for at least 6 hours prior to your procedure. (Exception: Blood Pressure Medication. See below.) . Transportation: Unless otherwise stated by your physician, you may drive yourself after the procedure. . Blood Pressure Medicine: Do not forget to take your blood pressure medicine with a sip of water the morning of the procedure. If your Diastolic (lower reading)is above 100 mmHg, elective cases will be cancelled/rescheduled. . Blood thinners: These will need to be stopped for procedures. Notify our staff if you are taking any blood thinners. Depending on which one you take, there will be specific instructions on how and when to stop it. . Diabetics on insulin: Notify the staff so that you can be scheduled 1st case in the morning. If your diabetes requires high dose insulin, take only  of your normal insulin dose the morning of the procedure and notify the staff that you have done so. . Preventing infections: Shower with an antibacterial soap the morning of your procedure.  . Build-up your immune system: Take 1000 mg of Vitamin C with every meal (3 times a day) the day prior to your procedure. . Antibiotics: Inform the staff if you have a condition or reason that requires you to take antibiotics before dental procedures. . Pregnancy: If you are pregnant, call and cancel the procedure. . Sickness: If you have a cold, fever, or any active infections, call and cancel the procedure. . Arrival: You must be in the facility at least 30 minutes prior to your scheduled procedure. . Children: Do not bring any children with you. . Dress  appropriately: Bring dark clothing that you would not mind if they get stained. . Valuables: Do not bring any jewelry or valuables.  Reasons to call and reschedule or cancel your procedure: (Following these recommendations will minimize the risk of a serious complication.) . Surgeries: Avoid having procedures within 2 weeks of any surgery. (Avoid for 2 weeks before or after any surgery). . Flu Shots: Avoid having procedures within 2 weeks of a flu shots or . (Avoid for 2 weeks before or after immunizations). . Barium: Avoid having a procedure within 7-10 days after having had a radiological study involving the use of radiological contrast. (Myelograms, Barium swallow or enema study). . Heart attacks: Avoid any elective procedures or surgeries for the initial 6 months after a "Myocardial Infarction" (Heart Attack). . Blood thinners: It is imperative that you stop these medications before procedures. Let us know if you if you take any blood thinner.  . Infection: Avoid procedures during or within two weeks of an infection (including chest colds or gastrointestinal problems). Symptoms associated with infections include: Localized redness, fever, chills, night sweats or profuse sweating, burning sensation when voiding, cough, congestion, stuffiness, runny nose, sore throat, diarrhea, nausea, vomiting, cold or Flu symptoms, recent or current infections. It is specially important if the infection is over the area that we intend to treat. . Heart and lung problems: Symptoms that may suggest an active cardiopulmonary problem include: cough, chest pain, breathing difficulties or shortness of breath, dizziness, ankle swelling, uncontrolled high or unusually low blood pressure, and/or palpitations. If you are experiencing any of these symptoms, cancel your procedure and contact your primary care physician for an evaluation.  Remember:  Regular   Business hours are:  Monday to Thursday 8:00 AM to 4:00  PM  Provider's Schedule: Erica Naveira, MD:  Procedure days: Tuesday and Thursday 7:30 AM to 4:00 PM  Erica Lateef, MD:  Procedure days: Monday and Wednesday 7:30 AM to 4:00 PM ____________________________________________________________________________________________    

## 2021-07-09 NOTE — Assessment & Plan Note (Signed)
Discussed genicular nerve block for left knee pain.  Patient states that she will think about this further.

## 2021-07-14 ENCOUNTER — Ambulatory Visit (HOSPITAL_BASED_OUTPATIENT_CLINIC_OR_DEPARTMENT_OTHER): Payer: BC Managed Care – PPO | Admitting: Student in an Organized Health Care Education/Training Program

## 2021-07-14 ENCOUNTER — Other Ambulatory Visit: Payer: Self-pay

## 2021-07-14 ENCOUNTER — Encounter: Payer: Self-pay | Admitting: Student in an Organized Health Care Education/Training Program

## 2021-07-14 ENCOUNTER — Ambulatory Visit
Admission: RE | Admit: 2021-07-14 | Discharge: 2021-07-14 | Disposition: A | Discharge status: Home / Self Care | Payer: BC Managed Care – PPO | Source: Ambulatory Visit | Attending: Student in an Organized Health Care Education/Training Program | Admitting: Student in an Organized Health Care Education/Training Program

## 2021-07-14 VITALS — BP 127/82 | HR 80 | Temp 97.9°F | Resp 18 | Ht 66.0 in | Wt 205.0 lb

## 2021-07-14 DIAGNOSIS — M5416 Radiculopathy, lumbar region: Secondary | ICD-10-CM | POA: Diagnosis not present

## 2021-07-14 MED ORDER — ROPIVACAINE HCL 2 MG/ML IJ SOLN
2.0000 mL | Freq: Once | INTRAMUSCULAR | Status: AC
  Administered 2021-07-14: 14:00:00 2 mL via EPIDURAL

## 2021-07-14 MED ORDER — LIDOCAINE HCL 2 % IJ SOLN
20.0000 mL | Freq: Once | INTRAMUSCULAR | Status: AC
  Administered 2021-07-14: 13:00:00 200 mg

## 2021-07-14 MED ORDER — IOHEXOL 180 MG/ML  SOLN
10.0000 mL | Freq: Once | INTRAMUSCULAR | Status: AC
  Administered 2021-07-14: 14:00:00 5 mL via EPIDURAL

## 2021-07-14 MED ORDER — SODIUM CHLORIDE 0.9% FLUSH
2.0000 mL | Freq: Once | INTRAVENOUS | Status: AC
  Administered 2021-07-14: 14:00:00 2 mL

## 2021-07-14 MED ORDER — DEXAMETHASONE SODIUM PHOSPHATE 10 MG/ML IJ SOLN
10.0000 mg | Freq: Once | INTRAMUSCULAR | Status: AC
  Administered 2021-07-14: 14:00:00 10 mg
  Filled 2021-07-14: qty 1

## 2021-07-14 MED ORDER — ROPIVACAINE HCL 2 MG/ML IJ SOLN
INTRAMUSCULAR | Status: AC
  Filled 2021-07-14: qty 20

## 2021-07-14 MED ORDER — SODIUM CHLORIDE (PF) 0.9 % IJ SOLN
INTRAMUSCULAR | Status: AC
  Filled 2021-07-14: qty 10

## 2021-07-14 MED ORDER — LIDOCAINE HCL 2 % IJ SOLN
INTRAMUSCULAR | Status: AC
  Filled 2021-07-14: qty 10

## 2021-07-14 NOTE — Patient Instructions (Signed)
Pain Management Discharge Instructions  General Discharge Instructions :  If you need to reach your doctor call: Monday-Friday 8:00 am - 4:00 pm at [phone] or toll free [phone].  After clinic hours [phone] to have operator reach doctor.  Bring all of your medication bottles to all your appointments in the pain clinic.  To cancel or reschedule your appointment with Pain Management please remember to call 24 hours in advance to avoid a fee.  Refer to the educational materials which you have been given on: General Risks, I had my Procedure. Discharge Instructions, Post Sedation.  Post Procedure Instructions:  The drugs you were given will stay in your system until tomorrow, so for the next 24 hours you should not drive, make any legal decisions or drink any alcoholic beverages.  You may eat anything you prefer, but it is better to start with liquids then soups and crackers, and gradually work up to solid foods.  Please notify your doctor immediately if you have any unusual bleeding, trouble breathing or pain that is not related to your normal pain.  Depending on the type of procedure that was done, some parts of your body may feel week and/or numb.  This usually clears up by tonight or the next day.  Walk with the use of an assistive device or accompanied by an adult for the 24 hours.  You may use ice on the affected area for the first 24 hours.  Put ice in a Ziploc bag and cover with a towel and place against area 15 minutes on 15 minutes off.  You may switch to heat after 24 hours.Epidural Steroid Injection Patient Information  Description: The epidural space surrounds the nerves as they exit the spinal cord.  In some patients, the nerves can be compressed and inflamed by a bulging disc or a tight spinal canal (spinal stenosis).  By injecting steroids into the epidural space, we can bring irritated nerves into direct contact with a potentially helpful medication.  These  steroids act directly on the irritated nerves and can reduce swelling and inflammation which often leads to decreased pain.  Epidural steroids may be injected anywhere along the spine and from the neck to the low back depending upon the location of your pain.   After numbing the skin with local anesthetic (like Novocaine), a small needle is passed into the epidural space slowly.  You may experience a sensation of pressure while this is being done.  The entire block usually last less than 10 minutes.  Conditions which may be treated by epidural steroids:  Low back and leg pain Neck and arm pain Spinal stenosis Post-laminectomy syndrome Herpes zoster (shingles) pain Pain from compression fractures  Preparation for the injection:  Do not eat any solid food or dairy products within 8 hours of your appointment.  You may drink clear liquids up to 3 hours before appointment.  Clear liquids include water, black coffee, juice or soda.  No milk or cream please. You may take your regular medication, including pain medications, with a sip of water before your appointment  Diabetics should hold regular insulin (if taken separately) and take 1/2 normal NPH dos the morning of the procedure.  Carry some sugar containing items with you to your appointment. A driver must accompany you and be prepared to drive you home after your procedure.  Bring all your current medications with your. An IV may be inserted and sedation may be given at the discretion of the physician.     A blood pressure cuff, EKG and other monitors will often be applied during the procedure.  Some patients may need to have extra oxygen administered for a short period. You will be asked to provide medical information, including your allergies, prior to the procedure.  We must know immediately if you are taking blood thinners (like Coumadin/Warfarin)  Or if you are allergic to IV iodine contrast (dye). We must know if you could possible be  pregnant.  Possible side-effects: Bleeding from needle site Infection (rare, may require surgery) Nerve injury (rare) Numbness & tingling (temporary) Difficulty urinating (rare, temporary) Spinal headache ( a headache worse with upright posture) Light -headedness (temporary) Pain at injection site (several days) Decreased blood pressure (temporary) Weakness in arm/leg (temporary) Pressure sensation in back/neck (temporary)  Call if you experience: Fever/chills associated with headache or increased back/neck pain. Headache worsened by an upright position. New onset weakness or numbness of an extremity below the injection site Hives or difficulty breathing (go to the emergency room) Inflammation or drainage at the infection site Severe back/neck pain Any new symptoms which are concerning to you  Please note:  Although the local anesthetic injected can often make your back or neck feel good for several hours after the injection, the pain will likely return.  It takes 3-7 days for steroids to work in the epidural space.  You may not notice any pain relief for at least that one week.  If effective, we will often do a series of three injections spaced 3-6 weeks apart to maximally decrease your pain.  After the initial series, we generally will wait several months before considering a repeat injection of the same type.  If you have any questions, please call [phone] [facility] 

## 2021-07-14 NOTE — Progress Notes (Signed)
Safety precautions to be maintained throughout the outpatient stay will include: orient to surroundings, keep bed in low position, maintain call bell within reach at all times, provide assistance with transfer out of bed and ambulation.  

## 2021-07-14 NOTE — Progress Notes (Signed)
PROVIDER NOTE: Information contained herein reflects review and annotations entered in association with encounter. Interpretation of such information and data should be left to medically-trained personnel. Information provided to patient can be located elsewhere in the medical record under "Patient Instructions". Document created using STT-dictation technology, any transcriptional errors that may result from process are unintentional.    Patient: Erica Cline  Service Category: Procedure Provider: Gillis Santa, MD DOB: [birth year] DOS: 07/14/2021 Location: [facility] MRN: [id number] Setting: Ambulatory - outpatient Referring Provider: No ref. provider found Type: Established Patient Specialty: Interventional Pain Management PCP: No primary care provider on file.  Primary Reason for Visit: Interventional Pain Management Treatment. CC: Back Pain (lower) and Leg Pain (right)   Procedure:          Anesthesia, Analgesia, Anxiolysis:  Type: Diagnostic Inter-Laminar Epidural Steroid Injection  #1  Region: Lumbar Level: L4-5 Level. Laterality: Right-Sided         Anesthesia: Local (1-2% Lidocaine)  Anxiolysis: None  Sedation: None  Guidance: Fluoroscopy           Position: Prone with head of the table was raised to facilitate breathing.   Indications: 1. Lumbar radicular pain (right L4/5)    Pain Score: Pre-procedure: 1 /10 Post-procedure: 0-No pain/10    Pre-op H&P Assessment:  Erica Cline is a 60 y.o. (year old), female patient, seen today for interventional treatment. She  has a past surgical history that includes Abdominal hysterectomy (2005); Cholecystectomy (2003); and Pacemaker insertion (10-07-2011). Erica Cline has a current medication list which includes the following prescription(s): gabapentin, ibuprofen, and citalopram. Her primarily concern today is the Back Pain (lower) and Leg Pain (right)  Initial Vital Signs:  Pulse/HCG Rate: 80ECG Heart Rate: 88 Temp: 97.9  F (36.6 C) Resp: 15 BP: 133/67 SpO2: 100 %  BMI: Estimated body mass index is 33.09 kg/m as calculated from the following:   Height as of this encounter: 5\' 6"  (1.676 m).   Weight as of this encounter: 205 lb (93 kg).  Risk Assessment: Allergies: Reviewed. She is allergic to latex.  Allergy Precautions: None required Coagulopathies: Reviewed. None identified.  Blood-thinner therapy: None at this time Active Infection(s): Reviewed. None identified. Erica Cline is afebrile  Site Confirmation: Erica Cline was asked to confirm the procedure and laterality before marking the site Procedure checklist: Completed Consent: Before the procedure and under the influence of no sedative(s), amnesic(s), or anxiolytics, the patient was informed of the treatment options, risks and possible complications. To fulfill our ethical and legal obligations, as recommended by the American Medical Association's Code of Ethics, I have informed the patient of my clinical impression; the nature and purpose of the treatment or procedure; the risks, benefits, and possible complications of the intervention; the alternatives, including doing nothing; the risk(s) and benefit(s) of the alternative treatment(s) or procedure(s); and the risk(s) and benefit(s) of doing nothing. The patient was provided information about the general risks and possible complications associated with the procedure. These may include, but are not limited to: failure to achieve desired goals, infection, bleeding, organ or nerve damage, allergic reactions, paralysis, and death. In addition, the patient was informed of those risks and complications associated to Spine-related procedures, such as failure to decrease pain; infection (i.e.: Meningitis, epidural or intraspinal abscess); bleeding (i.e.: epidural hematoma, subarachnoid hemorrhage, or any other type of intraspinal or peri-dural bleeding); organ or nerve damage (i.e.: Any type of peripheral nerve,  nerve root, or spinal cord injury) with subsequent damage to sensory,  motor, and/or autonomic systems, resulting in permanent pain, numbness, and/or weakness of one or several areas of the body; allergic reactions; (i.e.: anaphylactic reaction); and/or death. Furthermore, the patient was informed of those risks and complications associated with the medications. These include, but are not limited to: allergic reactions (i.e.: anaphylactic or anaphylactoid reaction(s)); adrenal axis suppression; blood sugar elevation that in diabetics may result in ketoacidosis or comma; water retention that in patients with history of congestive heart failure may result in shortness of breath, pulmonary edema, and decompensation with resultant heart failure; weight gain; swelling or edema; medication-induced neural toxicity; particulate matter embolism and blood vessel occlusion with resultant organ, and/or nervous system infarction; and/or aseptic necrosis of one or more joints. Finally, the patient was informed that Medicine is not an exact science; therefore, there is also the possibility of unforeseen or unpredictable risks and/or possible complications that may result in a catastrophic outcome. The patient indicated having understood very clearly. We have given the patient no guarantees and we have made no promises. Enough time was given to the patient to ask questions, all of which were answered to the patient's satisfaction. Erica Cline has indicated that she wanted to continue with the procedure. Attestation: I, the ordering provider, attest that I have discussed with the patient the benefits, risks, side-effects, alternatives, likelihood of achieving goals, and potential problems during recovery for the procedure that I have provided informed consent. Date   Time: 07/14/2021  1:01 PM  Pre-Procedure Preparation:  Monitoring: As per clinic protocol. Respiration, ETCO2, SpO2, BP, heart rate and rhythm monitor placed and  checked for adequate function Safety Precautions: Patient was assessed for positional comfort and pressure points before starting the procedure. Time-out: I initiated and conducted the "Time-out" before starting the procedure, as per protocol. The patient was asked to participate by confirming the accuracy of the "Time Out" information. Verification of the correct person, site, and procedure were performed and confirmed by me, the nursing staff, and the patient. "Time-out" conducted as per Joint Commission's Universal Protocol (UP.01.01.01). Time: 1341  Description of Procedure:          Target Area: The interlaminar space, initially targeting the lower laminar border of the superior vertebral body. Approach: Paramedial approach. Area Prepped: Entire Posterior Lumbar Region DuraPrep (Iodine Povacrylex [0.7% available iodine] and Isopropyl Alcohol, 74% w/w) Safety Precautions: Aspiration looking for blood return was conducted prior to all injections. At no point did we inject any substances, as a needle was being advanced. No attempts were made at seeking any paresthesias. Safe injection practices and needle disposal techniques used. Medications properly checked for expiration dates. SDV (single dose vial) medications used. Description of the Procedure: Protocol guidelines were followed. The procedure needle was introduced through the skin, ipsilateral to the reported pain, and advanced to the target area. Bone was contacted and the needle walked caudad, until the lamina was cleared. The epidural space was identified using loss-of-resistance technique with 2-3 ml of PF-NaCl (0.9% NSS), in a 5cc LOR glass syringe.  Vitals:   07/14/21 1310 07/14/21 1334 07/14/21 1342 07/14/21 1347  BP: 133/67 118/62 136/81 127/82  Pulse: 80     Resp: 15 14 16 18   Temp: 97.9 F (36.6 C)     TempSrc: Temporal     SpO2: 100% 100% 100% 100%  Weight: 205 lb (93 kg)     Height: 5\' 6"  (1.676 m)       Start Time:  1341 hrs. End Time: 1347 hrs.  Materials:  Needle(s) Type: Epidural needle Gauge: 22G Length: 3.5-in Medication(s): Please see orders for medications and dosing details. 5 cc solution made of 2 cc of preservative-free saline, 2 cc of 0.2% ropivacaine, 1 cc of Decadron 10 mg/cc.  Imaging Guidance (Spinal):          Type of Imaging Technique: Fluoroscopy Guidance (Spinal) Indication(s): Assistance in needle guidance and placement for procedures requiring needle placement in or near specific anatomical locations not easily accessible without such assistance. Exposure Time: Please see nurses notes. Contrast: Before injecting any contrast, we confirmed that the patient did not have an allergy to iodine, shellfish, or radiological contrast. Once satisfactory needle placement was completed at the desired level, radiological contrast was injected. Contrast injected under live fluoroscopy. No contrast complications. See chart for type and volume of contrast used. Fluoroscopic Guidance: I was personally present during the use of fluoroscopy. "Tunnel Vision Technique" used to obtain the best possible view of the target area. Parallax error corrected before commencing the procedure. "Direction-depth-direction" technique used to introduce the needle under continuous pulsed fluoroscopy. Once target was reached, antero-posterior, oblique, and lateral fluoroscopic projection used confirm needle placement in all planes. Images permanently stored in EMR. Interpretation: I personally interpreted the imaging intraoperatively. Adequate needle placement confirmed in multiple planes. Appropriate spread of contrast into desired area was observed. No evidence of afferent or efferent intravascular uptake. No intrathecal or subarachnoid spread observed. Permanent images saved into the patient's record.  Post-operative Assessment:  Post-procedure Vital Signs:  Pulse/HCG Rate: 8086 Temp: 97.9 F (36.6 C) Resp: 18 BP:  127/82 SpO2: 100 %  EBL: None  Complications: No immediate post-treatment complications observed by team, or reported by patient.  Note: The patient tolerated the entire procedure well. A repeat set of vitals were taken after the procedure and the patient was kept under observation following institutional policy, for this type of procedure. Post-procedural neurological assessment was performed, showing return to baseline, prior to discharge. The patient was provided with post-procedure discharge instructions, including a section on how to identify potential problems. Should any problems arise concerning this procedure, the patient was given instructions to immediately contact us, at any time, without hesitation. In any case, we plan to contact the patient by telephone for a follow-up status report regarding this interventional procedure.  Comments:  No additional relevant information. 5 out of 5 strength bilateral lower extremity: Plantar flexion, dorsiflexion, knee flexion, knee extension.   Plan of Care  Orders:  Orders Placed This Encounter  Procedures   DG PAIN CLIN[facility] C-ARM 1-60 MIN NO REPORT    Intraoperative interpretation by procedural physician at [facility].    Standing Status:   Standing    Number of Occurrences:   1    Order Specific Question:   Reason for exam:    Answer:   Assistance in needle guidance and placement for procedures requiring needle placement in or near specific anatomical locations not easily accessible without such assistance.    Medications ordered for procedure: Meds ordered this encounter  Medications   iohexol (OMNIPAQUE) 180 MG/ML injection 10 mL    Must be Myelogram-compatible. If not available, you may substitute with a water-soluble, non-ionic, hypoallergenic, myelogram-compatible radiological contrast medium.   lidocaine (XYLOCAINE) 2 % (with pres) injection 400 mg   ropivacaine (PF) 2 mg/mL (0.2%) (NAROPIN) injection 2 mL   sodium  chloride flush (NS) 0.9 % injection 2 mL   dexamethasone (DECADRON) injection 10 mg   Medications administered: We administered iohexol, lidocaine,  ropivacaine (PF) 2 mg/mL (0.2%), sodium chloride flush, and dexamethasone.  See the medical record for exact dosing, route, and time of administration.  Follow-up plan:   Return in about 4 weeks (around 08/11/2021) for Post Procedure Evaluation, virtual.       Right L4/5 ESI #1 07/14/21   Recent Visits Date Type Provider Dept  07/09/21 Office Visit Gillis Santa, MD [facility]  Showing recent visits within past 90 days and meeting all other requirements Today's Visits Date Type Provider Dept  07/14/21 Procedure visit Gillis Santa, MD [facility]  Showing today's visits and meeting all other requirements Future Appointments Date Type Provider Dept  08/11/21 Appointment Gillis Santa, MD [facility]  Showing future appointments within next 90 days and meeting all other requirements  Disposition: Discharge home  Discharge (Date   Time): 07/14/2021; 1400 hrs.   Primary Care Physician: No primary care provider on file. Location: [facility] Note by: Gillis Santa, MD Date: 07/14/2021; Time: 2:05 PM  Disclaimer:  Medicine is not an Chief Strategy Officer. The only guarantee in medicine is that nothing is guaranteed. It is important to note that the decision to proceed with this intervention was based on the information collected from the patient. The Data and conclusions were drawn from the patient's questionnaire, the interview, and the physical examination. Because the information was provided in large part by the patient, it cannot be guaranteed that it has not been purposely or unconsciously manipulated. Every effort has been made to obtain as much relevant data as possible for this evaluation. It is important to note that the conclusions that lead to this procedure are derived in large part  from the available data. Always take into account that the treatment will also be dependent on availability of resources and existing treatment guidelines, considered by other Pain Management Practitioners as being common knowledge and practice, at the time of the intervention. For Medico-Legal purposes, it is also important to point out that variation in procedural techniques and pharmacological choices are the acceptable norm. The indications, contraindications, technique, and results of the above procedure should only be interpreted and judged by a Board-Certified Interventional Pain Specialist with extensive familiarity and expertise in the same exact procedure and technique.

## 2021-07-15 ENCOUNTER — Telehealth: Payer: Self-pay

## 2021-07-15 NOTE — Telephone Encounter (Signed)
Post procedure phone call.  Patient states she is doing ok, having a little back nag. Instructed to put heat on it today and to call us for any further questions orc oncerns.

## 2021-08-05 DIAGNOSIS — Z23 Encounter for immunization: Secondary | ICD-10-CM | POA: Diagnosis not present

## 2021-08-05 DIAGNOSIS — Z1231 Encounter for screening mammogram for malignant neoplasm of breast: Secondary | ICD-10-CM | POA: Diagnosis not present

## 2021-08-05 DIAGNOSIS — Z950 Presence of cardiac pacemaker: Secondary | ICD-10-CM | POA: Diagnosis not present

## 2021-08-05 DIAGNOSIS — Z6834 Body mass index (BMI) 34.0-34.9, adult: Secondary | ICD-10-CM | POA: Diagnosis not present

## 2021-08-05 DIAGNOSIS — M1712 Unilateral primary osteoarthritis, left knee: Secondary | ICD-10-CM | POA: Diagnosis not present

## 2021-08-10 ENCOUNTER — Other Ambulatory Visit: Payer: Self-pay | Admitting: Student in an Organized Health Care Education/Training Program

## 2021-08-10 DIAGNOSIS — M5416 Radiculopathy, lumbar region: Secondary | ICD-10-CM

## 2021-08-11 ENCOUNTER — Ambulatory Visit
Payer: BC Managed Care – PPO | Attending: Student in an Organized Health Care Education/Training Program | Admitting: Student in an Organized Health Care Education/Training Program

## 2021-08-11 ENCOUNTER — Encounter: Payer: Self-pay | Admitting: Student in an Organized Health Care Education/Training Program

## 2021-08-11 ENCOUNTER — Telehealth: Payer: Self-pay

## 2021-08-11 ENCOUNTER — Other Ambulatory Visit: Payer: Self-pay

## 2021-08-11 DIAGNOSIS — M25562 Pain in left knee: Secondary | ICD-10-CM | POA: Diagnosis not present

## 2021-08-11 DIAGNOSIS — M5416 Radiculopathy, lumbar region: Secondary | ICD-10-CM | POA: Diagnosis not present

## 2021-08-11 DIAGNOSIS — G8929 Other chronic pain: Secondary | ICD-10-CM

## 2021-08-11 MED ORDER — GABAPENTIN 300 MG PO CAPS: 300.0000 mg | ORAL_CAPSULE | Freq: Every day | ORAL | 2 refills | Status: DC

## 2021-08-11 NOTE — Progress Notes (Signed)
Patient: Erica Cline  Service Category: E/M  Provider: Gillis Santa, MD  DOB: Feb 11, 1961  DOS: 08/11/2021  Location: Office  MRN: [id number]  Setting: Ambulatory outpatient  Referring Provider: No ref. provider found  Type: Established Patient  Specialty: Interventional Pain Management  PCP: No primary care provider on file.  Location: Home  Delivery: TeleHealth     Virtual Encounter - Pain Management PROVIDER NOTE: Information contained herein reflects review and annotations entered in association with encounter. Interpretation of such information and data should be left to medically-trained personnel. Information provided to patient can be located elsewhere in the medical record under "Patient Instructions". Document created using STT-dictation technology, any transcriptional errors that may result from process are unintentional.    Contact & Pharmacy Preferred: [phone] Home: [phone] (home) Mobile: [phone] (mobile) E-mail: [email or web address]  [facility] [id number] [place], [address] AT [address] [place] [address] Phone: [phone] Fax: [phone]   Pre-screening  Erica Cline offered "in-person" vs "virtual" encounter. She indicated preferring virtual for this encounter.   Reason COVID-19*   Social distancing based on CDC and AMA recommendations.   I contacted Erica Cline on 08/11/2021 via telephone.      I clearly identified myself as Gillis Santa, MD. I verified that I was speaking with the correct person using two identifiers (Name: Erica Cline, and date of birth: [birth year]).  Consent I sought verbal advanced consent from Erica Cline for virtual visit interactions. I informed Erica Cline of possible security and privacy concerns, risks, and limitations associated with providing "not-in-person" medical evaluation and management services. I also informed Erica Cline of the availability of "in-person"  appointments. Finally, I informed her that there would be a charge for the virtual visit and that she could be  personally, fully or partially, financially responsible for it. Erica Cline expressed understanding and agreed to proceed.   Historic Elements   Erica Cline is a 61 y.o. year old, female patient evaluated today after our last contact on 08/10/2021. Erica Cline  has a past medical history of Mobitz type 2 second degree heart block. She also  has a past surgical history that includes Abdominal hysterectomy (2005); Cholecystectomy (2003); and Pacemaker insertion (10-07-2011). Erica Cline has a current medication list which includes the following prescription(s): gabapentin. She  reports that she has quit smoking. She has never used smokeless tobacco. She reports current alcohol use. She reports that she does not use drugs. Erica Cline is allergic to latex.   HPI  Today, she is being contacted for a post-procedure assessment.  Post-procedure evaluation     Procedure:          Anesthesia, Analgesia, Anxiolysis:  Type: Diagnostic Inter-Laminar Epidural Steroid Injection  #1  Region: Lumbar Level: L4-5 Level. Laterality: Right-Sided         Anesthesia: Local (1-2% Lidocaine)  Anxiolysis: None  Sedation: None  Guidance: Fluoroscopy           Position: Prone with head of the table was raised to facilitate breathing.   Indications: 1. Lumbar radicular pain (right L4/5)    Pain Score: Pre-procedure: 1 /10 Post-procedure: 0-No pain/10     Effectiveness:  Initial hour after procedure: 100 %  Subsequent 4-6 hours post-procedure: 100 % Analgesia past initial 6 hours: 70 % (ongoing)  Ongoing improvement:  Analgesic:  50%, pain returning Function: Somewhat improved ROM: Somewhat improved   Leg pain associated with exertion,  overall pain is improved but is starting to notice episodic breakthroughs in pain.  We will continue to monitor. Also discussed treatment options for knee pain  related to knee osteoarthritis including viscosupplementation as well as genicular nerve block and possible RFA. Patient would like to do which she is currently doing for her knees if he gets worse, she states that she will contact us. Continues gabapentin at night at 300 mg nightly.   Laboratory Chemistry Profile   Renal Lab Results  Component Value Date   BUN 18 02/24/2013   CREATININE 0.76 02/24/2013   GFR 90.35 04/20/2012   GFRAA >60 10/05/2011   GFRNONAA >60 10/05/2011    Hepatic Lab Results  Component Value Date   AST 15 02/24/2013   ALT 15 02/24/2013   ALBUMIN 4.3 02/24/2013   ALKPHOS 96 02/24/2013    Electrolytes Lab Results  Component Value Date   NA 140 02/24/2013   K 4.4 02/24/2013   CL 105 02/24/2013   CALCIUM 10.1 02/24/2013    Bone No results found for: VD25OH, VD125OH2TOT, OI7124PY0, DX8338SN0, 25OHVITD1, 25OHVITD2, 25OHVITD3, TESTOFREE, TESTOSTERONE  Inflammation (CRP: Acute Phase) (ESR: Chronic Phase) No results found for: CRP, ESRSEDRATE, LATICACIDVEN       Note: Above Lab results reviewed.  Assessment  The primary encounter diagnosis was Lumbar radicular pain (right L4/5). A diagnosis of Chronic pain of left knee was also pertinent to this visit.  Plan of Care   [name] has a current medication list which includes the following long-term medication(s): gabapentin.  Pharmacotherapy (Medications Ordered): Meds ordered this encounter  Medications   gabapentin (NEURONTIN) 300 MG capsule    Sig: Take 1 capsule (300 mg total) by mouth at bedtime.    Dispense:  90 capsule    Refill:  2   Patient will call us if she notices increased low back and leg pain.  Can consider repeat lumbar steroid injection +/- L-MRI Discussed viscosupplementation as well as genicular nerve block for knee pain related to the osteoarthritis.  Patient will let us know if she wants to proceed with one of these  Follow-up plan:   Return if symptoms worsen or fail  to improve.     Right L4/5 ESI #1 07/14/21    Recent Visits Date Type Provider Dept  07/14/21 Procedure visit Gillis Santa, MD [facility]  07/09/21 Office Visit Gillis Santa, MD [facility]  Showing recent visits within past 90 days and meeting all other requirements Today's Visits Date Type Provider Dept  08/11/21 Office Visit Gillis Santa, MD [facility]  Showing today's visits and meeting all other requirements Future Appointments No visits were found meeting these conditions. Showing future appointments within next 90 days and meeting all other requirements  I discussed the assessment and treatment plan with the patient. The patient was provided an opportunity to ask questions and all were answered. The patient agreed with the plan and demonstrated an understanding of the instructions.  Patient advised to call back or seek an in-person evaluation if the symptoms or condition worsens.  Duration of encounter: 27minutes.  Note by: Gillis Santa, MD Date: 08/11/2021; Time: 11:37 AM

## 2021-08-11 NOTE — Telephone Encounter (Signed)
SCANNED REFERRAL TO REFERRAL

## 2021-09-03 ENCOUNTER — Encounter: Payer: BC Managed Care – PPO | Admitting: Cardiology

## 2021-09-19 DIAGNOSIS — Z1231 Encounter for screening mammogram for malignant neoplasm of breast: Secondary | ICD-10-CM | POA: Diagnosis not present

## 2021-09-19 LAB — HM MAMMOGRAPHY

## 2021-10-15 ENCOUNTER — Encounter: Payer: Self-pay | Admitting: Cardiology

## 2021-10-15 ENCOUNTER — Ambulatory Visit (INDEPENDENT_AMBULATORY_CARE_PROVIDER_SITE_OTHER): Payer: BC Managed Care – PPO | Admitting: Cardiology

## 2021-10-15 ENCOUNTER — Other Ambulatory Visit: Payer: Self-pay

## 2021-10-15 VITALS — BP 110/66 | HR 87 | Ht 66.0 in | Wt 216.0 lb

## 2021-10-15 DIAGNOSIS — I441 Atrioventricular block, second degree: Secondary | ICD-10-CM | POA: Diagnosis not present

## 2021-10-15 DIAGNOSIS — Z950 Presence of cardiac pacemaker: Secondary | ICD-10-CM

## 2021-10-15 DIAGNOSIS — I495 Sick sinus syndrome: Secondary | ICD-10-CM

## 2021-10-15 NOTE — Patient Instructions (Signed)
Medications: ?Your physician recommends that you continue on your current medications as directed. Please refer to the Current Medication list given to you today. ?*If you need a refill on your cardiac medications before your next appointment, please call your pharmacy* ? ?Lab Work: ?None. ?If you have labs (blood work) drawn today and your tests are completely normal, you will receive your results only by: ?MyChart Message (if you have MyChart) OR ?A paper copy in the mail ?If you have any lab test that is abnormal or we need to change your treatment, we will call you to review the results. ? ?Testing/Procedures: ?None. ? ?Follow-Up: ?At [facility], you and your health needs are our priority.  As part of our continuing mission to provide you with exceptional heart care, we have created designated Provider Care Teams.  These Care Teams include your primary Cardiologist (physician) and Advanced Practice Providers (APPs -  Physician Assistants and Nurse Practitioners) who all work together to provide you with the care you need, when you need it. ? ?Your physician wants you to follow-up in: 12 months with Lars Mage. The office will be in contact to set up your remote monitoring.  ? ?  You will receive a reminder letter in the mail two months in advance. If you don't receive a letter, please call our office to schedule the follow-up appointment. ? ?We recommend signing up for the patient portal called "MyChart".  Sign up information is provided on this After Visit Summary.  MyChart is used to connect with patients for Virtual Visits (Telemedicine).  Patients are able to view lab/test results, encounter notes, upcoming appointments, etc.  Non-urgent messages can be sent to your provider as well.   ?To learn more about what you can do with MyChart, go to [email or web address].   ? ?Any Other Special Instructions Will Be Listed Below (If Applicable).  ?

## 2021-10-15 NOTE — Progress Notes (Signed)
?Electrophysiology Office Note:   ? ?Date:  10/15/2021  ? ?ID:  BRITTIANY Cline, DOB 01/15/1961, MRN [id number] ? ?PCP:  No primary care provider on file.  ?[facility] Cardiologist:  None  ?[facility] Electrophysiologist:  Vickie Epley, MD  ? ?Referring MD: No ref. provider found  ? ?Chief Complaint: Permanent pacemaker ? ?History of Present Illness:   ? ?Erica Cline is a 61 y.o. female who presents for an evaluation of permanent pacemaker as a self-referral.  The patient recently relocated to the area from the [place] of [place].  The relocation was to be closer to family.  She had a permanent pacemaker implanted for sick sinus syndrome in 2013.  She has had no problems with the pacemaker.  She has been remotely followed by the clinic in [place].  She presents to establish care. ? ? ?  ?Past Medical History:  ?Diagnosis Date  ? Allergic   ? Anxiety   ? Arrhythmia   ? COVID   ? Dermatitis of eyelids of both eyes   ? Fatigue   ? GERD (gastroesophageal reflux disease)   ? Hyperlipidemia   ? Low serum vitamin B12   ? Mobitz type 2 second degree heart block   ? Sees Dr. Saralyn Pilar, pacer placed in 2013.  ? Sick sinus syndrome ([place])   ? ? ?Past Surgical History:  ?Procedure Laterality Date  ? ABDOMINAL HYSTERECTOMY  2005  ? CHOLECYSTECTOMY  2003  ? PACEMAKER INSERTION  10-07-2011  ? ? ?Current Medications: ?Current Meds  ?Medication Sig  ? gabapentin (NEURONTIN) 300 MG capsule Take 1 capsule (300 mg total) by mouth at bedtime.  ?  ? ?Allergies:   Latex  ? ?Social History  ? ?Socioeconomic History  ? Marital status: Married  ?  Spouse name: Not on file  ? Number of children: Not on file  ? Years of education: Not on file  ? Highest education level: Not on file  ?Occupational History  ? Not on file  ?Tobacco Use  ? Smoking status: Former  ? Smokeless tobacco: Never  ?Substance and Sexual Activity  ? Alcohol use: Yes  ?  Comment: 1 glass of wine 2 times monthly  ? Drug use: No  ? Sexual activity: Not on  file  ?Other Topics Concern  ? Not on file  ?Social History Narrative  ? Inpatient coder for [employer]  ? 3 children, married.  ? Mother is Erica Cline.  ? ?Social Determinants of Health  ? ?Financial Resource Strain: Not on file  ?Food Insecurity: Not on file  ?Transportation Needs: Not on file  ?Physical Activity: Not on file  ?Stress: Not on file  ?Social Connections: Not on file  ?  ? ?Family History: ?The patient's family history includes Colon cancer in her maternal grandmother. ? ?ROS:   ?Please see the history of present illness.    ?All other systems reviewed and are negative. ? ?EKGs/Labs/Other Studies Reviewed:   ? ?The following studies were reviewed today: ? ? ?October 15, 2021 in clinic device interrogation personally reviewed ?Battery longevity 10 to 15 months ?Atrial pacing 10% ?1 nonsustained ventricular high rate episode ?0 atrial high rate episodes ?Lead parameter stable ?Ventricular capture threshold 1.375 at 0.4 ms which is chronic ? ? ?EKG:  The ekg ordered today demonstrates low voltage QRS.  Sinus rhythm. ? ? ?Recent Labs: ?No results found for requested labs within last 8760 hours.  ?Recent Lipid Panel ?   ?Component Value Date/Time  ?  CHOL 175 04/20/2012 1126  ? TRIG 98.0 04/20/2012 1126  ? HDL 49.60 04/20/2012 1126  ? CHOLHDL 4 04/20/2012 1126  ? VLDL 19.6 04/20/2012 1126  ? LDLCALC 106 (H) 04/20/2012 1126  ? ? ?Physical Exam:   ? ?VS:  BP 110/66 (BP Location: Left Arm, Patient Position: Sitting, Cuff Size: Normal)   Pulse 87   Ht '5\' 6"'$  (1.676 m)   Wt 216 lb (98 kg)   SpO2 98%   BMI 34.86 kg/m?    ? ?Wt Readings from Last 3 Encounters:  ?10/15/21 216 lb (98 kg)  ?07/14/21 205 lb (93 kg)  ?07/09/21 201 lb (91.2 kg)  ?  ? ?GEN:  Well nourished, well developed in no acute distress ?HEENT: Normal ?NECK: No JVD; No carotid bruits ?LYMPHATICS: No lymphadenopathy ?CARDIAC: RRR, no murmurs, rubs, gallops.  Pacemaker pocket well-healed. ?RESPIRATORY:  Clear to auscultation without rales,  wheezing or rhonchi  ?ABDOMEN: Soft, non-tender, non-distended ?MUSCULOSKELETAL:  No edema; No deformity  ?SKIN: Warm and dry ?NEUROLOGIC:  Alert and oriented x 3 ?PSYCHIATRIC:  Normal affect  ? ? ?  ? ?ASSESSMENT:   ? ?1. Mobitz type 2 second degree heart block   ?2. Pacemaker   ?3. SSS (sick sinus syndrome) ([place])   ? ?PLAN:   ? ?In order of problems listed above: ? ?Pacemaker functioning appropriately.  We will transition remote monitoring to our clinic.  I will plan to see her back in 1 year or sooner as needed. ? ? ?Medication Adjustments/Labs and Tests Ordered: ?Current medicines are reviewed at length with the patient today.  Concerns regarding medicines are outlined above.  ?Orders Placed This Encounter  ?Procedures  ? EKG 12-Lead  ? ?No orders of the defined types were placed in this encounter. ? ? ? ?Signed, ?Lysbeth Galas T. Quentin Ore, MD, [facility], [place] ?10/15/2021 9:01 PM    ?Electrophysiology ?[place] ?

## 2021-10-17 ENCOUNTER — Telehealth: Payer: Self-pay

## 2021-10-17 NOTE — Telephone Encounter (Signed)
I have requested transfer in for patient in carelink, awaiting for her to be released. Remote schedule will be made when she is released  ?

## 2021-12-03 ENCOUNTER — Telehealth: Payer: BC Managed Care – PPO | Admitting: Student in an Organized Health Care Education/Training Program

## 2021-12-04 ENCOUNTER — Ambulatory Visit
Payer: BC Managed Care – PPO | Attending: Student in an Organized Health Care Education/Training Program | Admitting: Student in an Organized Health Care Education/Training Program

## 2021-12-04 ENCOUNTER — Encounter: Payer: Self-pay | Admitting: Student in an Organized Health Care Education/Training Program

## 2021-12-04 DIAGNOSIS — M25562 Pain in left knee: Secondary | ICD-10-CM

## 2021-12-04 DIAGNOSIS — G8929 Other chronic pain: Secondary | ICD-10-CM

## 2021-12-04 DIAGNOSIS — M1712 Unilateral primary osteoarthritis, left knee: Secondary | ICD-10-CM

## 2021-12-04 MED ORDER — GABAPENTIN 300 MG PO CAPS: 300.0000 mg | ORAL_CAPSULE | Freq: Every day | ORAL | 2 refills | Status: DC

## 2021-12-04 MED ORDER — DICLOFENAC SODIUM 75 MG PO TBEC: 75.0000 mg | DELAYED_RELEASE_TABLET | Freq: Two times a day (BID) | ORAL | 0 refills | Status: AC

## 2021-12-04 NOTE — Progress Notes (Signed)
Patient: Erica Cline  Service Category: E/M  Provider: Gillis Santa, MD  ?DOB: 28-Sep-1960  DOS: 12/04/2021  Location: Office  ?MRN: [id number]  Setting: Ambulatory outpatient  Referring Provider: No ref. provider found  ?Type: Established Patient  Specialty: Interventional Pain Management  PCP: No primary care provider on file.  ?Location: Remote location  Delivery: TeleHealth    ? ?Virtual Encounter - Pain Management ?PROVIDER NOTE: Information contained herein reflects review and annotations entered in association with encounter. Interpretation of such information and data should be left to medically-trained personnel. Information provided to patient can be located elsewhere in the medical record under "Patient Instructions". Document created using STT-dictation technology, any transcriptional errors that may result from process are unintentional.  ?  ?Contact & Pharmacy ?Preferred: [phone] ?Home: [phone] (home) ?Mobile: [phone] (mobile) ?E-mail: [email or web address]  ?[facility] [address] [facility] [place], [place] [address] AT [address] ?[address] [place] [address] ?Phone: [phone] Fax: [phone] ?  ?Pre-screening  ?Erica Cline offered "in-person" vs "virtual" encounter. She indicated preferring virtual for this encounter.  ? ?Reason ?COVID-19*  Social distancing based on CDC and AMA recommendations.  ? ?I contacted Erica Cline on 12/04/2021 via telephone.      I clearly identified myself as Gillis Santa, MD. I verified that I was speaking with the correct person using two identifiers (Name: Erica Cline, and date of birth: 01-Oct-1960). ? ?Consent ?I sought verbal advanced consent from Erica Cline for virtual visit interactions. I informed Erica Cline of possible security and privacy concerns, risks, and limitations associated with providing "not-in-person" medical evaluation and management services. I also informed Erica Cline of the availability of  "in-person" appointments. Finally, I informed her that there would be a charge for the virtual visit and that she could be  personally, fully or partially, financially responsible for it. Erica Cline expressed understanding and agreed to proceed.  ? ?Historic Elements   ?Erica Cline is a 61 y.o. year old, female patient evaluated today after our last contact on 08/11/2021. Erica Cline  has a past medical history of Allergic, Anxiety, Arrhythmia, COVID, Dermatitis of eyelids of both eyes, Fatigue, GERD (gastroesophageal reflux disease), Hyperlipidemia, Low serum vitamin B12, Mobitz type 2 second degree heart block, and Sick sinus syndrome ([place]). She also  has a past surgical history that includes Abdominal hysterectomy (2005); Cholecystectomy (2003); and Pacemaker insertion (10-07-2011). Erica Cline has a current medication list which includes the following prescription(s): diclofenac and gabapentin. She  reports that she has quit smoking. She has never used smokeless tobacco. She reports current alcohol use. She reports that she does not use drugs. Erica Cline is allergic to latex.  ? ?HPI  ?Today, she is being contacted for worsening of previously known (established) problem ? ?Patient states that she has been walking more frequently and is experiencing increased right knee pain that is worse with weightbearing.  She states that she walks [phone] steps a day it is not usually a problem but if she exceeds 10,000 steps she has pretty severe right knee pain.  She has been utilizing over-the-counter NSAIDs.  She is wondering if increasing her gabapentin could be helpful. ? ? ?Laboratory Chemistry Profile  ? ?Renal ?Lab Results  ?Component Value Date  ? BUN 18 02/24/2013  ? CREATININE 0.76 02/24/2013  ? GFR 90.35 04/20/2012  ? GFRAA >60 10/05/2011  ? GFRNONAA >60 10/05/2011  ?  Hepatic ?Lab Results  ?Component Value Date  ?  AST 15 02/24/2013  ? ALT 15 02/24/2013  ? ALBUMIN 4.3 02/24/2013  ? ALKPHOS 96 02/24/2013  ?   ?Electrolytes ?Lab Results  ?Component Value Date  ? NA 140 02/24/2013  ? K 4.4 02/24/2013  ? CL 105 02/24/2013  ? CALCIUM 10.1 02/24/2013  ?  Bone ?No results found for: [place], [id number], [id number], [id number], 25OHVITD1, 25OHVITD2, 25OHVITD3, TESTOFREE, TESTOSTERONE  ?Inflammation (CRP: Acute Phase) (ESR: Chronic Phase) ?No results found for: CRP, ESRSEDRATE, LATICACIDVEN    ?  ? ?Note: Above Lab results reviewed. ? ? ?Assessment  ?The primary encounter diagnosis was Chronic pain of left knee. A diagnosis of Primary osteoarthritis of left knee was also pertinent to this visit. ? ?Plan of Care  ? ?1. Chronic pain of left knee ? ?2. Primary osteoarthritis of left knee ?Discontinue over-the-counter NSAIDs.  Trial of diclofenac as below. ?Increase gabapentin to 600 mg nightly on nights that she has increased pain ?Follow-up in 8 weeks to see how she is doing with medication changes below.  If not effective, consider viscosupplementation versus genicular nerve block and possible RFA.  Patient in agreement with plan. ?She states that she has a cruise coming up in September and wants to have her knees in better shape before then. ? ? ?[name] has a current medication list which includes the following long-term medication(s): gabapentin. ? ?Pharmacotherapy (Medications Ordered): ?Meds ordered this encounter  ?Medications  ? diclofenac (VOLTAREN) 75 MG EC tablet  ?  Sig: Take 1 tablet (75 mg total) by mouth 2 (two) times daily for 15 days.  ?  Dispense:  30 tablet  ?  Refill:  0  ? gabapentin (NEURONTIN) 300 MG capsule  ?  Sig: Take 1-2 capsules (300-600 mg total) by mouth at bedtime.  ?  Dispense:  90 capsule  ?  Refill:  2  ? ?Orders:  ?No orders of the defined types were placed in this encounter. ? ?Follow-up plan:   ?Return in about 2 months (around 02/04/2022) for Medication Management, in person.   ?  ?Right L4/5 ESI #1 07/14/21  ? ?  ?Recent Visits ?No visits were found meeting these  conditions. ?Showing recent visits within past 90 days and meeting all other requirements ?Today's Visits ?Date Type Provider Dept  ?12/04/21 Office Visit Gillis Santa, MD [facility]  ?Showing today's visits and meeting all other requirements ?Future Appointments ?No visits were found meeting these conditions. ?Showing future appointments within next 90 days and meeting all other requirements ? ?I discussed the assessment and treatment plan with the patient. The patient was provided an opportunity to ask questions and all were answered. The patient agreed with the plan and demonstrated an understanding of the instructions. ? ?Patient advised to call back or seek an in-person evaluation if the symptoms or condition worsens. ? ?Duration of encounter: 75mnutes. ? ?Note by: BGillis Santa MD ?Date: 12/04/2021; Time: 3:26 PM ?

## 2022-01-16 ENCOUNTER — Encounter: Payer: Self-pay | Admitting: Cardiology

## 2022-02-03 ENCOUNTER — Ambulatory Visit
Payer: BC Managed Care – PPO | Attending: Student in an Organized Health Care Education/Training Program | Admitting: Student in an Organized Health Care Education/Training Program

## 2022-02-03 ENCOUNTER — Encounter: Payer: Self-pay | Admitting: Student in an Organized Health Care Education/Training Program

## 2022-02-03 VITALS — BP 124/45 | HR 82 | Temp 97.3°F | Ht 66.0 in | Wt 215.0 lb

## 2022-02-03 DIAGNOSIS — M25562 Pain in left knee: Secondary | ICD-10-CM | POA: Insufficient documentation

## 2022-02-03 DIAGNOSIS — M5416 Radiculopathy, lumbar region: Secondary | ICD-10-CM | POA: Diagnosis not present

## 2022-02-03 DIAGNOSIS — G8929 Other chronic pain: Secondary | ICD-10-CM | POA: Diagnosis not present

## 2022-02-03 DIAGNOSIS — M1712 Unilateral primary osteoarthritis, left knee: Secondary | ICD-10-CM | POA: Insufficient documentation

## 2022-02-03 NOTE — Progress Notes (Signed)
PROVIDER NOTE: Information contained herein reflects review and annotations entered in association with encounter. Interpretation of such information and data should be left to medically-trained personnel. Information provided to patient can be located elsewhere in the medical record under "Patient Instructions". Document created using STT-dictation technology, any transcriptional errors that may result from process are unintentional.    Patient: Erica Cline  Service Category: E/M  Provider: Edward Jolly, MD  DOB: Oct 06, 1960  DOS: 02/03/2022  Specialty: Interventional Pain Management  MRN: [id number]  Setting: Ambulatory outpatient  PCP: No primary care provider on file.  Type: Established Patient    Referring Provider: No ref. provider found  Location: Office  Delivery: Face-to-face     HPI  [name], a 61 y.o. year old female, is here today because of her Lumbar radicular pain [M54.16]. [name] primary complain today is Back Pain (Lower right) and Neck Pain (Left) Last encounter: My last encounter with her was on 12/04/2021. Pertinent problems: [name] has Lumbar radicular pain (right L4/5); Chronic pain of left knee; and Primary osteoarthritis of left knee on their pertinent problem list. Pain Assessment: Severity of Chronic pain is reported as a 5 /10. Location: Back Lower, Right/sometimes radiates as far as right foot. Onset: More than a month ago. Quality: Dull ("gripey"). Timing: Constant. Modifying factor(s): LESI helped for 4 months, water therapy. Vitals:  height is 5\' 6"  (1.676 m) and weight is 215 lb (97.5 kg). Her temporal temperature is 97.3 F (36.3 C) (abnormal). Her blood pressure is 124/45 (abnormal) and her pulse is 82. Her oxygen saturation is 99%.   Reason for encounter: evaluation of worsening, or previously known (established) problem.   1.  Increased low back pain with radiation into right leg related to L4-L5 lumbar radiculopathy.  Her previous lumbar  epidural steroid injection was done 07/14/2021 that provided her with 75 to 80% pain relief for approximately 6 months.  She has noticed increased pain radiating down her right leg when she is walking her dog.  We discussed repeating right L4-L5 ESI.  Risk and benefits reviewed and patient like to proceed.  2.  Left knee pain secondary to left knee osteoarthritis.  X-rays reveal loss of joint space of left knee.  Discussed left knee genicular nerve block to address to help left knee pain.    3.  Left dorsal ankle pain that is worse when she is going from sitting to standing.  No trauma to that ankle.  No paresthesias.  Not related to her radicular pain.  We will continue to monitor.   ROS  Constitutional: Denies any fever or chills Gastrointestinal: No reported hemesis, hematochezia, vomiting, or acute GI distress Musculoskeletal:  Left ankle pain, left knee pain Neurological:  Low back pain with radiation into right leg lateral side  Medication Review  gabapentin  History Review  Allergy: [name] is allergic to latex. Drug: [name]  reports no history of drug use. Alcohol:  reports current alcohol use. Tobacco:  reports that she has quit smoking. She has never used smokeless tobacco. Social: [name]  reports that she has quit smoking. She has never used smokeless tobacco. She reports current alcohol use. She reports that she does not use drugs. Medical:  has a past medical history of Allergic, Anxiety, Arrhythmia, COVID, Dermatitis of eyelids of both eyes, Fatigue, GERD (gastroesophageal reflux disease), Hyperlipidemia, Low serum vitamin B12, Mobitz type 2 second degree heart block, and Sick sinus syndrome (HCC). Surgical: [name]  has a past surgical history that includes Abdominal hysterectomy (2005); Cholecystectomy (2003); and Pacemaker insertion (10-07-2011). Family: family history includes Colon cancer in her maternal grandmother.  Laboratory Chemistry Profile    Renal Lab Results  Component Value Date   BUN 18 02/24/2013   CREATININE 0.76 02/24/2013   GFR 90.35 04/20/2012   GFRAA >60 10/05/2011   GFRNONAA >60 10/05/2011    Hepatic Lab Results  Component Value Date   AST 15 02/24/2013   ALT 15 02/24/2013   ALBUMIN 4.3 02/24/2013   ALKPHOS 96 02/24/2013    Electrolytes Lab Results  Component Value Date   NA 140 02/24/2013   K 4.4 02/24/2013   CL 105 02/24/2013   CALCIUM 10.1 02/24/2013    Bone No results found for: "VD25OH", "VD125OH2TOT", "XW9604VW0", "JW1191YN8", "25OHVITD1", "25OHVITD2", "25OHVITD3", "TESTOFREE", "TESTOSTERONE"  Inflammation (CRP: Acute Phase) (ESR: Chronic Phase) No results found for: "CRP", "ESRSEDRATE", "LATICACIDVEN"       Note: Above Lab results reviewed.   Physical Exam  General appearance: Well nourished, well developed, and well hydrated. In no apparent acute distress Mental status: Alert, oriented x 3 (person, place, & time)       Respiratory: No evidence of acute respiratory distress Eyes: PERLA Vitals: BP (!) 124/45   Pulse 82   Temp (!) 97.3 F (36.3 C) (Temporal)   Ht $R'5\' 6"'nj$  (1.676 m)   Wt 215 lb (97.5 kg)   SpO2 99%   BMI 34.70 kg/m  BMI: Estimated body mass index is 34.7 kg/m as calculated from the following:   Height as of this encounter: $RemoveBeforeD'5\' 6"'iXbknSvfPSObPk$  (1.676 m).   Weight as of this encounter: 215 lb (97.5 kg). Ideal: Ideal body weight: 59.3 kg (130 lb 11.7 oz) Adjusted ideal body weight: 74.6 kg (164 lb 7 oz)  Lumbar Spine Area Exam  Skin & Axial Inspection: No masses, redness, or swelling Alignment: Symmetrical Functional ROM: Pain restricted ROM       Stability: No instability detected Muscle Tone/Strength: Functionally intact. No obvious neuro-muscular anomalies detected. Sensory (Neurological): Dermatomal pain pattern, right L4-L5   Gait & Posture Assessment  Ambulation: Unassisted Gait: Relatively normal for age and body habitus Posture: WNL    Lower Extremity Exam       Side: Right lower extremity   Side: Left lower extremity  Stability: No instability observed           Stability: No instability observed          Skin & Extremity Inspection: Skin color, temperature, and hair growth are WNL. No peripheral edema or cyanosis. No masses, redness, swelling, asymmetry, or associated skin lesions. No contractures.   Skin & Extremity Inspection: Skin color, temperature, and hair growth are WNL. No peripheral edema or cyanosis. No masses, redness, swelling, asymmetry, or associated skin lesions. No contractures.  Functional ROM: Pain restricted ROM for hip and knee joints Limited SLR (straight leg raise)   Functional ROM: Pain restricted ROM for knee joint          Muscle Tone/Strength: Functionally intact. No obvious neuro-muscular anomalies detected.   Muscle Tone/Strength: Functionally intact. No obvious neuro-muscular anomalies detected.  Sensory (Neurological): Dermatomal pain pattern         Sensory (Neurological): Arthropathic arthralgia        DTR: Patellar: deferred today Achilles: deferred today Plantar: deferred today   DTR: Patellar: deferred today Achilles: deferred today Plantar: deferred today  Palpation: No palpable anomalies   Palpation: No palpable anomalies  Assessment   Diagnosis Status  1. Lumbar radicular pain (right L4/5)   2. Primary osteoarthritis of left knee   3. Chronic pain of left knee    Having a Flare-up Having a Flare-up Having a Flare-up   Updated Problems: Problem  Lumbar radicular pain (right L4/5)  Chronic Pain of Left Knee  Primary Osteoarthritis of Left Knee    Plan of Care  1. Lumbar radicular pain (right L4/5) - Lumbar Epidural Injection; Future  2. Primary osteoarthritis of left knee - GENICULAR NERVE BLOCK; Future  3. Chronic pain of left knee - GENICULAR NERVE BLOCK; Future  Repeat right L4-L5 ESI, positive response from previous 1 done in December. Left knee genicular nerve block for left  knee osteoarthritis and left knee pain. Continue gabapentin as prescribed. Continue walking and practice home stretching exercises for knee stiffness.  Orders:  Orders Placed This Encounter  Procedures   Lumbar Epidural Injection    Standing Status:   Future    Standing Expiration Date:   03/06/2022    Scheduling Instructions:     Procedure: Interlaminar Lumbar Epidural Steroid injection (LESI)            Laterality: Right L4/5     Sedation: Patient's choice.     Timeframe: ASAA    Order Specific Question:   Where will this procedure be performed?    Answer:   [facility]   GENICULAR NERVE BLOCK    Indication(s):  Sub-acute knee pain    Standing Status:   Future    Standing Expiration Date:   05/06/2022    Scheduling Instructions:     Side: LEFT     Sedation: without     Timeframe: 2 weeks    Order Specific Question:   Where will this procedure be performed?    Answer:   [facility]   Follow-up plan:   Return in about 13 days (around 02/16/2022) for R L4/5 ES #2; Left GNB (, in clinic NS, 40 mins).     Right L4/5 ESI #1 07/14/21      Recent Visits Date Type Provider Dept  12/04/21 Office Visit Gillis Santa, MD [facility]  Showing recent visits within past 90 days and meeting all other requirements Today's Visits Date Type Provider Dept  02/03/22 Office Visit Gillis Santa, MD [facility]  Showing today's visits and meeting all other requirements Future Appointments No visits were found meeting these conditions. Showing future appointments within next 90 days and meeting all other requirements  I discussed the assessment and treatment plan with the patient. The patient was provided an opportunity to ask questions and all were answered. The patient agreed with the plan and demonstrated an understanding of the instructions.  Patient advised to call back or seek an in-person evaluation if the symptoms or condition worsens.  Duration  of encounter: 88minutes.  Total time on encounter, as per AMA guidelines included both the face-to-face and non-face-to-face time personally spent by the physician and/or other qualified health care professional(s) on the day of the encounter (includes time in activities that require the physician or other qualified health care professional and does not include time in activities normally performed by clinical staff). Physician's time may include the following activities when performed: preparing to see the patient (eg, review of tests, pre-charting review of records) obtaining and/or reviewing separately obtained history performing a medically appropriate examination and/or evaluation counseling and educating the patient/family/caregiver ordering medications, tests, or procedures referring  and communicating with other health care professionals (when not separately reported) documenting clinical information in the electronic or other health record independently interpreting results (not separately reported) and communicating results to the patient/ family/caregiver care coordination (not separately reported)  Note by: Gillis Santa, MD Date: 02/03/2022; Time: 2:35 PM

## 2022-02-03 NOTE — Progress Notes (Signed)
Safety precautions to be maintained throughout the outpatient stay will include: orient to surroundings, keep bed in low position, maintain call bell within reach at all times, provide assistance with transfer out of bed and ambulation.  

## 2022-02-08 ENCOUNTER — Emergency Department: Payer: BC Managed Care – PPO

## 2022-02-08 ENCOUNTER — Other Ambulatory Visit: Payer: Self-pay

## 2022-02-08 ENCOUNTER — Encounter: Payer: Self-pay | Admitting: Emergency Medicine

## 2022-02-08 ENCOUNTER — Emergency Department
Admission: EM | Admit: 2022-02-08 | Discharge: 2022-02-08 | Disposition: A | Discharge status: Home / Self Care | Payer: BC Managed Care – PPO | Attending: Emergency Medicine | Admitting: Emergency Medicine

## 2022-02-08 DIAGNOSIS — J189 Pneumonia, unspecified organism: Secondary | ICD-10-CM | POA: Diagnosis not present

## 2022-02-08 DIAGNOSIS — R0602 Shortness of breath: Secondary | ICD-10-CM | POA: Diagnosis not present

## 2022-02-08 DIAGNOSIS — Z950 Presence of cardiac pacemaker: Secondary | ICD-10-CM | POA: Insufficient documentation

## 2022-02-08 DIAGNOSIS — J181 Lobar pneumonia, unspecified organism: Secondary | ICD-10-CM | POA: Diagnosis not present

## 2022-02-08 DIAGNOSIS — Z20822 Contact with and (suspected) exposure to covid-19: Secondary | ICD-10-CM | POA: Diagnosis not present

## 2022-02-08 DIAGNOSIS — R509 Fever, unspecified: Secondary | ICD-10-CM | POA: Diagnosis not present

## 2022-02-08 LAB — CBC
HCT: 37.4 % (ref 36.0–46.0)
Hemoglobin: 12.2 g/dL (ref 12.0–15.0)
MCH: 27.9 pg (ref 26.0–34.0)
MCHC: 32.6 g/dL (ref 30.0–36.0)
MCV: 85.6 fL (ref 80.0–100.0)
Platelets: 228 10*3/uL (ref 150–400)
RBC: 4.37 MIL/uL (ref 3.87–5.11)
RDW: 12.1 % (ref 11.5–15.5)
WBC: 7.4 10*3/uL (ref 4.0–10.5)
nRBC: 0 % (ref 0.0–0.2)

## 2022-02-08 LAB — RESP PANEL BY RT-PCR (FLU A&B, COVID) ARPGX2
Influenza A by PCR: NEGATIVE
Influenza B by PCR: NEGATIVE
SARS Coronavirus 2 by RT PCR: NEGATIVE

## 2022-02-08 LAB — BASIC METABOLIC PANEL
Anion gap: 7 (ref 5–15)
BUN: 16 mg/dL (ref 8–23)
CO2: 24 mmol/L (ref 22–32)
Calcium: 9.6 mg/dL (ref 8.9–10.3)
Chloride: 107 mmol/L (ref 98–111)
Creatinine, Ser: 0.71 mg/dL (ref 0.44–1.00)
GFR, Estimated: >60 mL/min (ref >60)
Glucose, Bld: 98 mg/dL (ref 70–99)
Potassium: 3.9 mmol/L (ref 3.5–5.1)
Sodium: 138 mmol/L (ref 135–145)

## 2022-02-08 LAB — TROPONIN I (HIGH SENSITIVITY)
Troponin I (High Sensitivity): 3 ng/L (ref <18)
Troponin I (High Sensitivity): 3 ng/L (ref <18)

## 2022-02-08 MED ORDER — ONDANSETRON 4 MG PO TBDP: 4.0000 mg | ORAL_TABLET | Freq: Three times a day (TID) | ORAL | 0 refills | Status: AC | PRN

## 2022-02-08 MED ORDER — AMOXICILLIN-POT CLAVULANATE 875-125 MG PO TABS: 1.0000 | ORAL_TABLET | Freq: Two times a day (BID) | ORAL | 0 refills | Status: AC

## 2022-02-08 MED ORDER — FLUCONAZOLE 150 MG PO TABS: 150.0000 mg | ORAL_TABLET | Freq: Every day | ORAL | 0 refills | Status: AC

## 2022-02-08 MED ORDER — DOXYCYCLINE MONOHYDRATE 100 MG PO TABS: 100.0000 mg | ORAL_TABLET | Freq: Two times a day (BID) | ORAL | 0 refills | Status: AC

## 2022-02-08 MED ORDER — ACETAMINOPHEN 500 MG PO TABS
1000.0000 mg | ORAL_TABLET | Freq: Once | ORAL | Status: AC
  Administered 2022-02-08: 1000 mg via ORAL
  Filled 2022-02-08: qty 2

## 2022-02-08 NOTE — Discharge Instructions (Addendum)
We are starting you on 2 different antibiotics for the pneumonia.  You can take the fluconazole once the antibiotics are complete.  You can use the Tylenol 1 g every 8 hours and the ibuprofen 600 every 6-8 hours with food.  Your COVID test is pending.  You will need to follow-up for repeat x-ray with your primary care doctor as we discussed.  If develop worsening shortness of breath swelling in 1 leg then return to the ER for recheck   IMPRESSION: Infiltrate in the right base worrisome for pneumonia given history. Recommend short-term follow-up imaging to ensure resolution.

## 2022-02-08 NOTE — ED Provider Notes (Addendum)
[facility] Provider Note    Event Date/Time   First MD Initiated Contact with Patient 02/08/22 1244     (approximate)   History   Shortness of Breath   HPI  Erica Cline is a 61 y.o. female with GERD hyperlipidemia pacemaker who comes in with concerns for shortness of breath.  Patient reports having a fever that started on Wednesday.  She then started developing some shortness of breath that was this morning with a nonproductive cough and headache associated with it.  She reports that she has not been on any recent antibiotics.  She is not able to cough up any mucus but just is coughing.  She reports a fever as high of 102 which she is taken Tylenol and ibuprofen.  She is not taking anything since 9 AM and temperature here is 99 when I rechecked it.  She denies any significant chest pain but just the shortness of breath that started around 10:30 AM which is why she came to the ER.  She reports some associated nausea and decreased p.o. intake and just feeling ill overall.  She denies any history of blood clots, history of cancer, swelling in 1 leg, recent travel, recent surgery or other concerns for pulmonary embolism.   Physical Exam   Triage Vital Signs: ED Triage Vitals  Enc Vitals Group     BP 02/08/22 1118 116/67     Pulse Rate 02/08/22 1118 88     Resp 02/08/22 1118 16     Temp 02/08/22 1118 98.8 F (37.1 C)     Temp src --      SpO2 02/08/22 1118 95 %     Weight 02/08/22 1121 215 lb (97.5 kg)     Height 02/08/22 1121 '5\' 6"'$  (1.676 m)     Head Circumference --      Peak Flow --      Pain Score 02/08/22 1121 0     Pain Loc --      Pain Edu? --      Excl. in [place]? --     Most recent vital signs: Vitals:   02/08/22 1118 02/08/22 1245  BP: 116/67 118/69  Pulse: 88 79  Resp: 16   Temp: 98.8 F (37.1 C)   SpO2: 95% 99%     General: Awake, no distress.  CV:  Good peripheral perfusion.  Resp:  Normal effort.  Clear lung Abd:  No  distention.  Other:  No swelling in the legs, no calf tenderness   ED Results / Procedures / Treatments   Labs (all labs ordered are listed, but only abnormal results are displayed) Labs Reviewed  RESP PANEL BY RT-PCR (FLU A&B, COVID) ARPGX2  BASIC METABOLIC PANEL  CBC  URINALYSIS, ROUTINE W REFLEX MICROSCOPIC  TROPONIN I (HIGH SENSITIVITY)  TROPONIN I (HIGH SENSITIVITY)     EKG  My interpretation of EKG:  Normal sinus rate 89 without any ST elevation or T wave inversions except for lead V3, normal intervals   RADIOLOGY I have reviewed the xray personally and interpreted and patient has right lower lobe pneumonia   PROCEDURES:  Critical Care performed: No  Procedures   MEDICATIONS ORDERED IN ED: Medications  acetaminophen (TYLENOL) tablet 1,000 mg (has no administration in time range)     IMPRESSION / MDM / ASSESSMENT AND PLAN / ED COURSE  I reviewed the triage vital signs and the nursing notes.   Patient's presentation is most consistent with acute presentation  with potential threat to life or bodily function.   Patient comes in with fevers, cough for the past 5 days.  Suspect this is most likely pneumonia and chest x-ray confirms pneumonia.  She is got no risk factors for PE and we discussed CT imaging but have elected to hold off at this time but she understands to return if she develops worsening shortness of breath swelling in 1 leg or any other concerns in regards to this.  But given the fevers up to 102 and the x-ray this seems more likely consistent with pneumonia.  Will get COVID test.  Offered patient IV fluid IV Zofran but patient would prefer to avoid IV if possible.  We will do ambulatory trial and see how she does to see if patient could potentially go home.  Curb 65 is 0.  Patient is very well-appearing does not appear septic or bacteremic.   BMP reassuring.  CBC normal. troponin negative  Patient's oxygen level was 100% with ambulating she stated  that she felt well.  She is requesting some Tylenol for her temperature 99.9 which I have given her.  We have discussed IV and she is elected to hold off at this time.  She feels very comfortable going home and return if she develops worsening shortness of breath or any other concerns otherwise we will start her on antibiotics for pneumonia.  She understands to follow-up with a primary care doctor for repeat x-ray to ensure resolution.  I considered admission for patient but given she is not hypoxic and she can tolerate taking oral medications patient feels comfortable with discharge  The patient is on the cardiac monitor to evaluate for evidence of arrhythmia and/or significant heart rate changes.      FINAL CLINICAL IMPRESSION(S) / ED DIAGNOSES   Final diagnoses:  Community acquired pneumonia of right lower lobe of lung     Rx / DC Orders   ED Discharge Orders     None        Note:  This document was prepared using Dragon voice recognition software and may include unintentional dictation errors.   Vanessa [place], MD 02/08/22 1402    Vanessa [place], MD 02/08/22 [phone]

## 2022-02-08 NOTE — ED Notes (Signed)
Pt ambulated about halfway around the department and back.  O2 sat remained at 100%.  EDP made aware.

## 2022-02-08 NOTE — ED Triage Notes (Signed)
Pt via POV from home. Pt c/o fever that started Wednesday. States that she started having SOB this AM and dry non-productive cough and headache. Denies any respiratory illness. Denies CP. Pt is A&Ox4 and NAD.

## 2022-02-12 ENCOUNTER — Ambulatory Visit (INDEPENDENT_AMBULATORY_CARE_PROVIDER_SITE_OTHER): Payer: BC Managed Care – PPO

## 2022-02-12 DIAGNOSIS — I441 Atrioventricular block, second degree: Secondary | ICD-10-CM

## 2022-02-19 LAB — CUP PACEART REMOTE DEVICE CHECK
Battery Impedance: 2579 Ohm
Battery Remaining Longevity: 28 mo
Battery Voltage: 2.73 V
Brady Statistic AP VP Percent: 0 %
Brady Statistic AP VS Percent: 8 %
Brady Statistic AS VP Percent: 0 %
Brady Statistic AS VS Percent: 91 %
Date Time Interrogation Session: 20230719204517
Implantable Pulse Generator Implant Date: 20130313
Lead Channel Impedance Value: 394 Ohm
Lead Channel Impedance Value: 451 Ohm
Lead Channel Pacing Threshold Amplitude: 0.375 V
Lead Channel Pacing Threshold Amplitude: 1.625 V
Lead Channel Pacing Threshold Pulse Width: 0.4 ms
Lead Channel Pacing Threshold Pulse Width: 0.4 ms
Lead Channel Setting Pacing Amplitude: 1.5 V
Lead Channel Setting Pacing Amplitude: 3.25 V
Lead Channel Setting Pacing Pulse Width: 0.4 ms
Lead Channel Setting Sensing Sensitivity: 4 mV

## 2022-02-27 ENCOUNTER — Encounter: Payer: Self-pay | Admitting: Family Medicine

## 2022-02-27 ENCOUNTER — Ambulatory Visit (INDEPENDENT_AMBULATORY_CARE_PROVIDER_SITE_OTHER): Payer: BC Managed Care – PPO | Admitting: Family Medicine

## 2022-02-27 VITALS — BP 120/80 | HR 75 | Temp 98.1°F | Ht 66.0 in | Wt 219.4 lb

## 2022-02-27 DIAGNOSIS — E669 Obesity, unspecified: Secondary | ICD-10-CM | POA: Diagnosis not present

## 2022-02-27 DIAGNOSIS — Z6835 Body mass index (BMI) 35.0-35.9, adult: Secondary | ICD-10-CM | POA: Diagnosis not present

## 2022-02-27 DIAGNOSIS — I441 Atrioventricular block, second degree: Secondary | ICD-10-CM | POA: Diagnosis not present

## 2022-02-27 DIAGNOSIS — J189 Pneumonia, unspecified organism: Secondary | ICD-10-CM | POA: Diagnosis not present

## 2022-02-27 DIAGNOSIS — M5416 Radiculopathy, lumbar region: Secondary | ICD-10-CM

## 2022-02-27 NOTE — Patient Instructions (Signed)
Nice to meet you. Please work on adding in some exercise as we discussed. We will have you return in a couple of weeks for vaccinations and your chest x-ray.

## 2022-02-28 LAB — HEMOGLOBIN A1C
Hgb A1c MFr Bld: 5 % of total Hgb (ref <5.7)
Mean Plasma Glucose: 97 mg/dL
eAG (mmol/L): 5.4 mmol/L

## 2022-02-28 LAB — LIPID PANEL
Cholesterol: 203 mg/dL — ABNORMAL HIGH (ref <200)
HDL: 60 mg/dL (ref >=50)
LDL Cholesterol (Calc): 123 mg/dL (calc) — ABNORMAL HIGH
Non-HDL Cholesterol (Calc): 143 mg/dL (calc) — ABNORMAL HIGH (ref <130)
Total CHOL/HDL Ratio: 3.4 (calc) (ref <5.0)
Triglycerides: 92 mg/dL (ref <150)

## 2022-03-02 DIAGNOSIS — J189 Pneumonia, unspecified organism: Secondary | ICD-10-CM | POA: Insufficient documentation

## 2022-03-02 NOTE — Assessment & Plan Note (Signed)
She will continue to follow with pain management.

## 2022-03-02 NOTE — Assessment & Plan Note (Signed)
Discussed trying to increase exercise.  Discussed that she would need to have a good exercise regimen and healthy diet in place for [place] to consider weight loss medication.  We will follow-up in 3 months and see where her weight is.  If she has not been able to lose any weight we can consider weight loss medication.

## 2022-03-02 NOTE — Assessment & Plan Note (Signed)
Patient has recovered quite well.  She will return in 2 weeks for follow-up chest x-ray.

## 2022-03-02 NOTE — Assessment & Plan Note (Signed)
Continue to follow with electrophysiology.

## 2022-03-02 NOTE — Progress Notes (Signed)
Tommi Rumps, MD Phone: [phone]  Erica Cline is a 61 y.o. female who presents today for new patient visit.  Pneumonia: Patient recently had pneumonia.  She reports she is recovered well after taking Augmentin and doxycycline.  She notes no shortness of breath, cough, or fevers.  Obesity: Patient notes she has cereal such as frosted mini wheats and fruit for breakfast.  She will have broccoli and a chicken thigh for lunch.  Has salmon and rice and vegetables for dinner.  She is eating less desserts.  She drinks coffee all day that does not put much creamer in it.  She has tried multiple different diets though none have been effective.  She notes walking is limited by her radicular symptoms.  She swims some in the summer and does some yoga.  Mobitz type II heart block: Patient has a pacemaker.  She notes she has 2 years left on her battery.  She is following with cardiology.  [place]  Reports they recommended 3-year recall.  Social History   Tobacco Use  Smoking Status Former  Smokeless Tobacco Never    Current Outpatient Medications on File Prior to Visit  Medication Sig Dispense Refill   gabapentin (NEURONTIN) 300 MG capsule Take 1-2 capsules (300-600 mg total) by mouth at bedtime. 90 capsule 2   No current facility-administered medications on file prior to visit.     ROS see history of present illness  Objective  Physical Exam Vitals:   02/27/22 1548  BP: 120/80  Pulse: 75  Temp: 98.1 F (36.7 C)  SpO2: 99%    BP Readings from Last 3 Encounters:  02/27/22 120/80  02/08/22 116/72  02/03/22 (!) 124/45   Wt Readings from Last 3 Encounters:  02/27/22 219 lb 6.4 oz (99.5 kg)  02/08/22 215 lb (97.5 kg)  02/03/22 215 lb (97.5 kg)    Physical Exam Constitutional:      General: She is not in acute distress.    Appearance: She is not diaphoretic.  Cardiovascular:     Rate and Rhythm: Normal rate  and regular rhythm.     Heart sounds: Normal heart sounds.  Pulmonary:     Effort: Pulmonary effort is normal.     Breath sounds: Normal breath sounds.  Musculoskeletal:     Right lower leg: No edema.     Left lower leg: No edema.  Skin:    General: Skin is warm and dry.  Neurological:     Mental Status: She is alert.      Assessment/Plan: Please see individual problem list.  Problem List Items Addressed This Visit     Lumbar radicular pain (right L4/5) (Chronic)    She will continue to follow with pain management.      Mobitz type 2 second degree heart block (Chronic)    Continue to follow with electrophysiology.      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  Discussed that she would need to have a good exercise regimen and healthy diet in place for [place] to consider weight loss medication.  We will follow-up in 3 months and see where her weight is.  If she has not been able to lose any weight we can consider weight loss medication.      Community acquired pneumonia - Primary    Patient has recovered quite well.  She will return in 2 weeks for  follow-up chest x-ray.      Relevant Orders   DG Chest 2 View   Other Visit Diagnoses     Obesity (BMI 35.0-39.9 without comorbidity)       Relevant Orders   HgB A1c (Completed)   Lipid panel (Completed)        Health Maintenance: We are requesting colonoscopy records.  Mammogram is up-to-date at [facility].  She was encouraged to get the Shingrix vaccine.  Return in about 2 weeks (around 03/13/2022) for Chest x-ray, nurse visit for Prevnar 20 and Shingrix, 3 months with PCP for weight follow-up.   Tommi Rumps, MD [place]

## 2022-03-05 ENCOUNTER — Telehealth: Payer: Self-pay | Admitting: Family Medicine

## 2022-03-05 NOTE — Telephone Encounter (Signed)
Patient has a lab appt 03/13/22, there are no orders in.

## 2022-03-06 NOTE — Progress Notes (Signed)
Remote pacemaker transmission.   

## 2022-03-06 NOTE — Telephone Encounter (Signed)
This is for a chest x-ray.

## 2022-03-09 ENCOUNTER — Ambulatory Visit
Payer: BC Managed Care – PPO | Attending: Student in an Organized Health Care Education/Training Program | Admitting: Student in an Organized Health Care Education/Training Program

## 2022-03-09 ENCOUNTER — Ambulatory Visit
Admission: RE | Admit: 2022-03-09 | Discharge: 2022-03-09 | Disposition: A | Discharge status: Home / Self Care | Payer: BC Managed Care – PPO | Source: Ambulatory Visit | Attending: Student in an Organized Health Care Education/Training Program | Admitting: Student in an Organized Health Care Education/Training Program

## 2022-03-09 ENCOUNTER — Encounter: Payer: Self-pay | Admitting: Student in an Organized Health Care Education/Training Program

## 2022-03-09 VITALS — BP 110/68 | HR 75 | Temp 97.2°F | Resp 12 | Ht 66.0 in | Wt 220.0 lb

## 2022-03-09 DIAGNOSIS — G8929 Other chronic pain: Secondary | ICD-10-CM | POA: Diagnosis not present

## 2022-03-09 DIAGNOSIS — M25562 Pain in left knee: Secondary | ICD-10-CM | POA: Diagnosis not present

## 2022-03-09 DIAGNOSIS — M1712 Unilateral primary osteoarthritis, left knee: Secondary | ICD-10-CM | POA: Diagnosis not present

## 2022-03-09 DIAGNOSIS — M5416 Radiculopathy, lumbar region: Secondary | ICD-10-CM | POA: Diagnosis not present

## 2022-03-09 MED ORDER — SODIUM CHLORIDE (PF) 0.9 % IJ SOLN
INTRAMUSCULAR | Status: AC
  Filled 2022-03-09: qty 10

## 2022-03-09 MED ORDER — DEXAMETHASONE SODIUM PHOSPHATE 10 MG/ML IJ SOLN
INTRAMUSCULAR | Status: AC
  Filled 2022-03-09: qty 1

## 2022-03-09 MED ORDER — SODIUM CHLORIDE 0.9% FLUSH
2.0000 mL | Freq: Once | INTRAVENOUS | Status: AC
Start: 2022-03-09 — End: 2022-03-09
  Administered 2022-03-09: 2 mL

## 2022-03-09 MED ORDER — IOHEXOL 180 MG/ML  SOLN
INTRAMUSCULAR | Status: AC
  Filled 2022-03-09: qty 20

## 2022-03-09 MED ORDER — ROPIVACAINE HCL 2 MG/ML IJ SOLN
INTRAMUSCULAR | Status: AC
  Filled 2022-03-09: qty 20

## 2022-03-09 MED ORDER — LIDOCAINE HCL 2 % IJ SOLN
20.0000 mL | Freq: Once | INTRAMUSCULAR | Status: AC
  Administered 2022-03-09: 400 mg

## 2022-03-09 MED ORDER — ROPIVACAINE HCL 2 MG/ML IJ SOLN
2.0000 mL | Freq: Once | INTRAMUSCULAR | Status: AC
  Administered 2022-03-09: 2 mL via EPIDURAL

## 2022-03-09 MED ORDER — LIDOCAINE HCL 2 % IJ SOLN
INTRAMUSCULAR | Status: AC
  Filled 2022-03-09: qty 20

## 2022-03-09 MED ORDER — DEXAMETHASONE SODIUM PHOSPHATE 10 MG/ML IJ SOLN
10.0000 mg | Freq: Once | INTRAMUSCULAR | Status: AC
  Administered 2022-03-09: 10 mg

## 2022-03-09 MED ORDER — ROPIVACAINE HCL 2 MG/ML IJ SOLN
9.0000 mL | Freq: Once | INTRAMUSCULAR | Status: AC
Start: 2022-03-09 — End: 2022-03-09
  Administered 2022-03-09: 9 mL via PERINEURAL

## 2022-03-09 NOTE — Progress Notes (Signed)
Safety precautions to be maintained throughout the outpatient stay will include: orient to surroundings, keep bed in low position, maintain call bell within reach at all times, provide assistance with transfer out of bed and ambulation.  

## 2022-03-09 NOTE — Progress Notes (Signed)
PROVIDER NOTE: Information contained herein reflects review and annotations entered in association with encounter. Interpretation of such information and data should be left to medically-trained personnel. Information provided to patient can be located elsewhere in the medical record under "Patient Instructions". Document created using STT-dictation technology, any transcriptional errors that may result from process are unintentional.    Patient: Erica Cline  Service Category: Procedure Provider: Gillis Santa, MD DOB: 05-10-1961 DOS: 03/09/2022 Location: [facility] MRN: [id number] Setting: Ambulatory - outpatient Referring Provider: Gillis Santa, MD Type: Established Patient Specialty: Interventional Pain Management PCP: Leone Haven, MD  Primary Reason for Visit: Interventional Pain Management Treatment. CC: Back Pain (lower) and Knee Pain (left)   Procedure:          Anesthesia, Analgesia, Anxiolysis:  Type: Therapeutic Inter-Laminar Epidural Steroid Injection  #2  Region: Lumbar Level: L4-5 Level. Laterality: Right-Sided         Anesthesia: Local (1-2% Lidocaine)  Anxiolysis: None  Sedation: None  Guidance: Fluoroscopy           Position: Prone with head of the table was raised to facilitate breathing.   Indications: Right L4-L5 lumbar radicular pain   Pain Score: Pre-procedure: 5  (back; LEFT knee score 3-4)/10 Post-procedure: 0-No pain/10    Pre-op H&P Assessment:  Ms. Schamberger is a 61 y.o. (year old), female patient, seen today for interventional treatment. She  has a past surgical history that includes Abdominal hysterectomy (2005); Cholecystectomy (2003); and Pacemaker insertion (10-07-2011). Ms. Champa has a current medication list which includes the following prescription(s): gabapentin. Her primarily concern today is the Back Pain (lower) and Knee Pain (left)  Initial Vital Signs:  Pulse/HCG Rate: 75ECG Heart Rate: 90 Temp: (!) 97.2 F (36.2  C) Resp: 16 BP: (!) 120/58 SpO2: 100 %  BMI: Estimated body mass index is 35.51 kg/m as calculated from the following:   Height as of this encounter: '5\' 6"'$  (1.676 m).   Weight as of this encounter: 220 lb (99.8 kg).  Risk Assessment: Allergies: Reviewed. She is allergic to latex.  Allergy Precautions: None required Coagulopathies: Reviewed. None identified.  Blood-thinner therapy: None at this time Active Infection(s): Reviewed. None identified. Ms. Kendra is afebrile  Site Confirmation: Ms. Bonneau was asked to confirm the procedure and laterality before marking the site Procedure checklist: Completed Consent: Before the procedure and under the influence of no sedative(s), amnesic(s), or anxiolytics, the patient was informed of the treatment options, risks and possible complications. To fulfill our ethical and legal obligations, as recommended by the American Medical Association's Code of Ethics, I have informed the patient of my clinical impression; the nature and purpose of the treatment or procedure; the risks, benefits, and possible complications of the intervention; the alternatives, including doing nothing; the risk(s) and benefit(s) of the alternative treatment(s) or procedure(s); and the risk(s) and benefit(s) of doing nothing. The patient was provided information about the general risks and possible complications associated with the procedure. These may include, but are not limited to: failure to achieve desired goals, infection, bleeding, organ or nerve damage, allergic reactions, paralysis, and death. In addition, the patient was informed of those risks and complications associated to Spine-related procedures, such as failure to decrease pain; infection (i.e.: Meningitis, epidural or intraspinal abscess); bleeding (i.e.: epidural hematoma, subarachnoid hemorrhage, or any other type of intraspinal or peri-dural bleeding); organ or nerve damage (i.e.: Any type of peripheral nerve,  nerve root, or spinal cord injury) with subsequent damage to sensory, motor,  and/or autonomic systems, resulting in permanent pain, numbness, and/or weakness of one or several areas of the body; allergic reactions; (i.e.: anaphylactic reaction); and/or death. Furthermore, the patient was informed of those risks and complications associated with the medications. These include, but are not limited to: allergic reactions (i.e.: anaphylactic or anaphylactoid reaction(s)); adrenal axis suppression; blood sugar elevation that in diabetics may result in ketoacidosis or comma; water retention that in patients with history of congestive heart failure may result in shortness of breath, pulmonary edema, and decompensation with resultant heart failure; weight gain; swelling or edema; medication-induced neural toxicity; particulate matter embolism and blood vessel occlusion with resultant organ, and/or nervous system infarction; and/or aseptic necrosis of one or more joints. Finally, the patient was informed that Medicine is not an exact science; therefore, there is also the possibility of unforeseen or unpredictable risks and/or possible complications that may result in a catastrophic outcome. The patient indicated having understood very clearly. We have given the patient no guarantees and we have made no promises. Enough time was given to the patient to ask questions, all of which were answered to the patient's satisfaction. Ms. Orchard has indicated that she wanted to continue with the procedure. Attestation: I, the ordering provider, attest that I have discussed with the patient the benefits, risks, side-effects, alternatives, likelihood of achieving goals, and potential problems during recovery for the procedure that I have provided informed consent. Date  Time: 03/09/2022 10:05 AM  Pre-Procedure Preparation:  Monitoring: As per clinic protocol. Respiration, ETCO2, SpO2, BP, heart rate and rhythm monitor placed and  checked for adequate function Safety Precautions: Patient was assessed for positional comfort and pressure points before starting the procedure. Time-out: I initiated and conducted the "Time-out" before starting the procedure, as per protocol. The patient was asked to participate by confirming the accuracy of the "Time Out" information. Verification of the correct person, site, and procedure were performed and confirmed by me, the nursing staff, and the patient. "Time-out" conducted as per Joint Commission's Universal Protocol (UP.01.01.01). Time: 1102  Description of Procedure:          Target Area: The interlaminar space, initially targeting the lower laminar border of the superior vertebral body. Approach: Paramedial approach. Area Prepped: Entire Posterior Lumbar Region DuraPrep (Iodine Povacrylex [0.7% available iodine] and Isopropyl Alcohol, 74% w/w) Safety Precautions: Aspiration looking for blood return was conducted prior to all injections. At no point did we inject any substances, as a needle was being advanced. No attempts were made at seeking any paresthesias. Safe injection practices and needle disposal techniques used. Medications properly checked for expiration dates. SDV (single dose vial) medications used. Description of the Procedure: Protocol guidelines were followed. The procedure needle was introduced through the skin, ipsilateral to the reported pain, and advanced to the target area. Bone was contacted and the needle walked caudad, until the lamina was cleared. The epidural space was identified using "loss-of-resistance technique" with 2-3 ml of PF-NaCl (0.9% NSS), in a 5cc LOR glass syringe.  Vitals:   03/09/22 1048 03/09/22 1053 03/09/22 1103 03/09/22 1107  BP: 113/74 119/66 110/65 110/68  Pulse:      Resp: '16 12 13 12  '$ Temp:      TempSrc:      SpO2: 96% 100% 99% 100%  Weight:      Height:        Start Time: 1102 hrs. End Time: 1107 hrs.  Materials:  Needle(s)  Type: Epidural needle Gauge: 22G Length: 3.5-in Medication(s): Please see  orders for medications and dosing details. 6 cc solution made of 3 cc of preservative-free saline, 2 cc of 0.2% ropivacaine, 1 cc of Decadron 10 mg/cc.  Imaging Guidance (Spinal):          Type of Imaging Technique: Fluoroscopy Guidance (Spinal) Indication(s): Assistance in needle guidance and placement for procedures requiring needle placement in or near specific anatomical locations not easily accessible without such assistance. Exposure Time: Please see nurses notes. Contrast: Before injecting any contrast, we confirmed that the patient did not have an allergy to iodine, shellfish, or radiological contrast. Once satisfactory needle placement was completed at the desired level, radiological contrast was injected. Contrast injected under live fluoroscopy. No contrast complications. See chart for type and volume of contrast used. Fluoroscopic Guidance: I was personally present during the use of fluoroscopy. "Tunnel Vision Technique" used to obtain the best possible view of the target area. Parallax error corrected before commencing the procedure. "Direction-depth-direction" technique used to introduce the needle under continuous pulsed fluoroscopy. Once target was reached, antero-posterior, oblique, and lateral fluoroscopic projection used confirm needle placement in all planes. Images permanently stored in EMR. Interpretation: I personally interpreted the imaging intraoperatively. Adequate needle placement confirmed in multiple planes. Appropriate spread of contrast into desired area was observed. No evidence of afferent or efferent intravascular uptake. No intrathecal or subarachnoid spread observed. Permanent images saved into the patient's record.  Post-operative Assessment:  Post-procedure Vital Signs:  Pulse/HCG Rate: 7568 Temp: (!) 97.2 F (36.2 C) Resp: 12 BP: 110/68 SpO2: 100 %  EBL: None  Complications: No  immediate post-treatment complications observed by team, or reported by patient.  Note: The patient tolerated the entire procedure well. A repeat set of vitals were taken after the procedure and the patient was kept under observation following institutional policy, for this type of procedure. Post-procedural neurological assessment was performed, showing return to baseline, prior to discharge. The patient was provided with post-procedure discharge instructions, including a section on how to identify potential problems. Should any problems arise concerning this procedure, the patient was given instructions to immediately contact us, at any time, without hesitation. In any case, we plan to contact the patient by telephone for a follow-up status report regarding this interventional procedure.  Comments:  No additional relevant information. 5 out of 5 strength bilateral lower extremity: Plantar flexion, dorsiflexion, knee flexion, knee extension.   Plan of Care  Orders:  Orders Placed This Encounter  Procedures   DG PA[facility] C-ARM 1-60 MIN NO REPORT    Intraoperative interpretation by procedural physician at [facility].    Standing Status:   Standing    Number of Occurrences:   1    Order Specific Question:   Reason for exam:    Answer:   Assistance in needle guidance and placement for procedures requiring needle placement in or near specific anatomical locations not easily accessible without such assistance.    Medications ordered for procedure: Meds ordered this encounter  Medications   lidocaine (XYLOCAINE) 2 % (with pres) injection 400 mg   ropivacaine (PF) 2 mg/mL (0.2%) (NAROPIN) injection 2 mL   sodium chloride flush (NS) 0.9 % injection 2 mL   dexamethasone (DECADRON) injection 10 mg   dexamethasone (DECADRON) injection 10 mg   ropivacaine (PF) 2 mg/mL (0.2%) (NAROPIN) injection 9 mL   Medications administered: We administered lidocaine, ropivacaine (PF) 2 mg/mL  (0.2%), sodium chloride flush, dexamethasone, dexamethasone, and ropivacaine (PF) 2 mg/mL (0.2%).  See the medical record for exact  dosing, route, and time of administration.  Follow-up plan:   Return in about 6 weeks (around 04/20/2022).       Right L4/5 ESI #1 07/14/21, #2 03/09/2022, left genicular nerve block 03/09/2022   Recent Visits Date Type Provider Dept  02/03/22 Office Visit Gillis Santa, MD [facility]  Showing recent visits within past 90 days and meeting all other requirements Today's Visits Date Type Provider Dept  03/09/22 Procedure visit Gillis Santa, MD [facility]  Showing today's visits and meeting all other requirements Future Appointments Date Type Provider Dept  04/28/22 Appointment Gillis Santa, MD [facility]  Showing future appointments within next 90 days and meeting all other requirements  Disposition: Discharge home  Discharge (Date  Time): 03/09/2022; 1112 hrs.   Primary Care Physician: Leone Haven, MD Location: [facility] Note by: Gillis Santa, MD Date: 03/09/2022; Time: 2:05 PM  Disclaimer:  Medicine is not an exact science. The only guarantee in medicine is that nothing is guaranteed. It is important to note that the decision to proceed with this intervention was based on the information collected from the patient. The Data and conclusions were drawn from the patient's questionnaire, the interview, and the physical examination. Because the information was provided in large part by the patient, it cannot be guaranteed that it has not been purposely or unconsciously manipulated. Every effort has been made to obtain as much relevant data as possible for this evaluation. It is important to note that the conclusions that lead to this procedure are derived in large part from the available data. Always take into account that the treatment will also be dependent on availability of resources and  existing treatment guidelines, considered by other Pain Management Practitioners as being common knowledge and practice, at the time of the intervention. For Medico-Legal purposes, it is also important to point out that variation in procedural techniques and pharmacological choices are the acceptable norm. The indications, contraindications, technique, and results of the above procedure should only be interpreted and judged by a Board-Certified Interventional Pain Specialist with extensive familiarity and expertise in the same exact procedure and technique.

## 2022-03-09 NOTE — Patient Instructions (Signed)

## 2022-03-09 NOTE — Progress Notes (Signed)
PROVIDER NOTE: Interpretation of information contained herein should be left to medically-trained personnel. Specific patient instructions are provided elsewhere under "Patient Instructions" section of medical record. This document was created in part using STT-dictation technology, any transcriptional errors that may result from this process are unintentional.  Patient: Erica Cline Type: Established DOB: 1961/04/18 MRN: [id number] PCP: Erica Haven, MD  Service: Procedure DOS: 03/09/2022 Setting: Ambulatory Location: Ambulatory outpatient facility Delivery: Face-to-face Provider: Gillis Santa, MD Specialty: Interventional Pain Management Specialty designation: 09 Location: Outpatient facility Ref. Prov.: Gillis Santa, MD    Primary Reason for Visit: Interventional Pain Management Treatment. CC: Back Pain (lower) and Knee Pain (left)   Procedure:               Type: Genicular Nerves Block (Superolateral, Superomedial, and Inferomedial Genicular Nerves)  #1  Laterality: Left (-LT)  Level: Superior and inferior to the knee joint.  Imaging: Fluoroscopic guidance Anesthesia: Local anesthesia (1-2% Lidocaine) DOS: 03/09/2022  Performed by: Gillis Santa, MD  Purpose: Diagnostic/Therapeutic Indications: Chronic knee pain severe enough to impact quality of life or function. Rationale (medical necessity): procedure needed and proper for the diagnosis and/or treatment of Erica Cline's medical symptoms and needs.  Left knee osteoarthritis  NAS-11 Pain score:   Pre-procedure: 5  (back; LEFT knee score 3-4)/10   Post-procedure: 0-No pain/10     Target: For Genicular Nerve block(s), the targets are: the superolateral genicular nerve, located in the lateral distal portion of the femoral shaft as it curves to form the lateral epicondyle, in the region of the distal femoral metaphysis; the superomedial genicular nerve, located in the medial distal portion of the femoral shaft as it curves  to form the medial epicondyle; and the inferomedial genicular nerve, located in the medial, proximal portion of the tibial shaft, as it curves to form the medial epicondyle, in the region of the proximal tibial metaphysis.  Location: Superolateral, Superomedial, and Inferomedial aspects of knee joint.  Region: Lateral, Anterior, and Medial aspects of the knee joint, above and below the knee joint proper. Approach: Percutaneous  Type of procedure: Percutaneous perineural nerve block. The genicular nerve block is a motor-sparing technique that anesthetizes the sensory terminal branches innervating the knee joint, resulting in anesthesia of the anterior compartment of the knee. The distribution of anesthesia of each nerve is mostly in the corresponding quadrant.  Neuroanatomy: The superolateral genicular nerve (SLGN) courses around the femur shaft to pass between the vastus lateralis and the lateral epicondyle. It accompanies the superior lateral genicular artery. The superomedial genicular nerve (SMGN) courses around the femur shaft, following the superior medial genicular artery, to pass between the adductor magnus tendon and the medial epicondyle below the vastus medialis. The inferolateral genicular nerve (ILGN) courses around the tibial lateral epicondyle deep to the lateral collateral ligament, following the inferior lateral genicular artery, superior of the fibula head. The inferomedial genicular nerve (IMGN) courses horizontally below the medial collateral ligament between the tibial medial epicondyle and the insertion of the collateral ligament. It accompanies the inferior medial genicular artery. The recurrent peroneal nerve originates in the inferior popliteal region from the common peroneal nerve and courses horizontally around the fibula to pass just inferior of the fibula head and travel superior to the anterolateral tibial epicondyle. It accompanies the recurrent tibial artery.  Position / Prep /  Materials:  Position: Supine, Modified Fowler's position with pillows under the targeted knee(s). The patient is placed in a supine position with the knee slightly flexed by  placing a pillow in the popliteal fossa. Prep solution: DuraPrep (Iodine Povacrylex [0.7% available iodine] and Isopropyl Alcohol, 74% w/w) Prep Area: Entire knee area, from mid-thigh to mid-shin, lateral, anterior, and medial aspects. Materials:  Tray: Block Needle(s):  Type: Spinal  Gauge (G): 22  Length: 3.5-in  Qty: 3  Pre-op H&P Assessment:  Erica Cline is a 61 y.o. (year old), female patient, seen today for interventional treatment. She  has a past surgical history that includes Abdominal hysterectomy (2005); Cholecystectomy (2003); and Pacemaker insertion (10-07-2011). Erica Cline has a current medication list which includes the following prescription(s): gabapentin. Her primarily concern today is the Back Pain (lower) and Knee Pain (left)  Initial Vital Signs:  Pulse/HCG Rate: 75ECG Heart Rate: 90 Temp: (!) 97.2 F (36.2 C) Resp: 16 BP: (!) 120/58 SpO2: 100 %  BMI: Estimated body mass index is 35.51 kg/m as calculated from the following:   Height as of this encounter: '5\' 6"'$  (1.676 m).   Weight as of this encounter: 220 lb (99.8 kg).  Risk Assessment: Allergies: Reviewed. She is allergic to latex.  Allergy Precautions: None required Coagulopathies: Reviewed. None identified.  Blood-thinner therapy: None at this time Active Infection(s): Reviewed. None identified. Erica Cline is afebrile  Site Confirmation: Erica Cline was asked to confirm the procedure and laterality before marking the site Procedure checklist: Completed Consent: Before the procedure and under the influence of no sedative(s), amnesic(s), or anxiolytics, the patient was informed of the treatment options, risks and possible complications. To fulfill our ethical and legal obligations, as recommended by the American Medical Association's Code  of Ethics, I have informed the patient of my clinical impression; the nature and purpose of the treatment or procedure; the risks, benefits, and possible complications of the intervention; the alternatives, including doing nothing; the risk(s) and benefit(s) of the alternative treatment(s) or procedure(s); and the risk(s) and benefit(s) of doing nothing. The patient was provided information about the general risks and possible complications associated with the procedure. These may include, but are not limited to: failure to achieve desired goals, infection, bleeding, organ or nerve damage, allergic reactions, paralysis, and death. In addition, the patient was informed of those risks and complications associated to the procedure, such as failure to decrease pain; infection; bleeding; organ or nerve damage with subsequent damage to sensory, motor, and/or autonomic systems, resulting in permanent pain, numbness, and/or weakness of one or several areas of the body; allergic reactions; (i.e.: anaphylactic reaction); and/or death. Furthermore, the patient was informed of those risks and complications associated with the medications. These include, but are not limited to: allergic reactions (i.e.: anaphylactic or anaphylactoid reaction(s)); adrenal axis suppression; blood sugar elevation that in diabetics may result in ketoacidosis or comma; water retention that in patients with history of congestive heart failure may result in shortness of breath, pulmonary edema, and decompensation with resultant heart failure; weight gain; swelling or edema; medication-induced neural toxicity; particulate matter embolism and blood vessel occlusion with resultant organ, and/or nervous system infarction; and/or aseptic necrosis of one or more joints. Finally, the patient was informed that Medicine is not an exact science; therefore, there is also the possibility of unforeseen or unpredictable risks and/or possible complications that  may result in a catastrophic outcome. The patient indicated having understood very clearly. We have given the patient no guarantees and we have made no promises. Enough time was given to the patient to ask questions, all of which were answered to the patient's satisfaction. Ms. Luhrs has indicated that  she wanted to continue with the procedure. Attestation: I, the ordering provider, attest that I have discussed with the patient the benefits, risks, side-effects, alternatives, likelihood of achieving goals, and potential problems during recovery for the procedure that I have provided informed consent. Date  Time: 03/09/2022 10:05 AM  Pre-Procedure Preparation:  Monitoring: As per clinic protocol. Respiration, ETCO2, SpO2, BP, heart rate and rhythm monitor placed and checked for adequate function Safety Precautions: Patient was assessed for positional comfort and pressure points before starting the procedure. Time-out: I initiated and conducted the "Time-out" before starting the procedure, as per protocol. The patient was asked to participate by confirming the accuracy of the "Time Out" information. Verification of the correct person, site, and procedure were performed and confirmed by me, the nursing staff, and the patient. "Time-out" conducted as per Joint Commission's Universal Protocol (UP.01.01.01). Time: 1102  Description/Narrative of Procedure:          Rationale (medical necessity): procedure needed and proper for the diagnosis and/or treatment of the patient's medical symptoms and needs. Procedural Technique Safety Precautions: Aspiration looking for blood return was conducted prior to all injections. At no point did we inject any substances, as a needle was being advanced. No attempts were made at seeking any paresthesias. Safe injection practices and needle disposal techniques used. Medications properly checked for expiration dates. SDV (single dose vial) medications used. Description of the  Procedure: Protocol guidelines were followed. The patient was assisted into a comfortable position. The target area was identified and the area prepped in the usual manner. Skin & deeper tissues infiltrated with local anesthetic. Appropriate amount of time allowed to pass for local anesthetics to take effect. The procedure needles were then advanced to the target area. Proper needle placement secured. Negative aspiration confirmed. Solution injected in intermittent fashion, asking for systemic symptoms every 0.5cc of injectate. The needles were then removed and the area cleansed, making sure to leave some of the prepping solution back to take advantage of its long term bactericidal properties.   9cc solution made of 8 cc of 0.2% ropivacaine, 1 cc of Decadron 10 mg/cc.  3 cc injected at each level above for the left genicular nerves.             Vitals:   03/09/22 1048 03/09/22 1053 03/09/22 1103 03/09/22 1107  BP: 113/74 119/66 110/65 110/68  Pulse:      Resp: '16 12 13 12  '$ Temp:      TempSrc:      SpO2: 96% 100% 99% 100%  Weight:      Height:         Start Time: 1102 hrs. End Time: 1107 hrs.  Imaging Guidance (Non-Spinal):          Type of Imaging Technique: Fluoroscopy Guidance (Non-Spinal) Indication(s): Assistance in needle guidance and placement for procedures requiring needle placement in or near specific anatomical locations not easily accessible without such assistance. Exposure Time: Please see nurses notes. Contrast: None used. Fluoroscopic Guidance: I was personally present during the use of fluoroscopy. "Tunnel Vision Technique" used to obtain the best possible view of the target area. Parallax error corrected before commencing the procedure. "Direction-depth-direction" technique used to introduce the needle under continuous pulsed fluoroscopy. Once target was reached, antero-posterior, oblique, and lateral fluoroscopic projection used confirm needle placement in all planes.  Images permanently stored in EMR. Interpretation: No contrast injected. I personally interpreted the imaging intraoperatively. Adequate needle placement confirmed in multiple planes. Permanent images saved into the  patient's record.  Post-operative Assessment:  Post-procedure Vital Signs:  Pulse/HCG Rate: 7568 Temp:  (!) 97.2 F (36.2 C) Resp: 12 BP: 110/68 SpO2: 100 %  EBL: None  Complications: No immediate post-treatment complications observed by team, or reported by patient.  Note: The patient tolerated the entire procedure well. A repeat set of vitals were taken after the procedure and the patient was kept under observation following institutional policy, for this type of procedure. Post-procedural neurological assessment was performed, showing return to baseline, prior to discharge. The patient was provided with post-procedure discharge instructions, including a section on how to identify potential problems. Should any problems arise concerning this procedure, the patient was given instructions to immediately contact us, at any time, without hesitation. In any case, we plan to contact the patient by telephone for a follow-up status report regarding this interventional procedure.  Comments:  No additional relevant information.  Plan of Care  Orders:  Orders Placed This Encounter  Procedures   DG PAIN CLINIC C-ARM 1-60 MIN NO REPORT    Intraoperative interpretation by procedural physician at [place].    Standing Status:   Standing    Number of Occurrences:   1    Order Specific Question:   Reason for exam:    Answer:   Assistance in needle guidance and placement for procedures requiring needle placement in or near specific anatomical locations not easily accessible without such assistance.    Medications ordered for procedure: Meds ordered this encounter  Medications   lidocaine (XYLOCAINE) 2 % (with pres) injection 400 mg   ropivacaine (PF) 2 mg/mL (0.2%)  (NAROPIN) injection 2 mL   sodium chloride flush (NS) 0.9 % injection 2 mL   dexamethasone (DECADRON) injection 10 mg   dexamethasone (DECADRON) injection 10 mg   ropivacaine (PF) 2 mg/mL (0.2%) (NAROPIN) injection 9 mL   Medications administered: We administered lidocaine, ropivacaine (PF) 2 mg/mL (0.2%), sodium chloride flush, dexamethasone, dexamethasone, and ropivacaine (PF) 2 mg/mL (0.2%).  See the medical record for exact dosing, route, and time of administration.  Follow-up plan:   Return in about 6 weeks (around 04/20/2022).       Right L4/5 ESI #1 07/14/21, #2 03/09/2022, left genicular nerve block 03/09/2022      Recent Visits Date Type Provider Dept  02/03/22 Office Visit Gillis Santa, MD [facility]  Showing recent visits within past 90 days and meeting all other requirements Today's Visits Date Type Provider Dept  03/09/22 Procedure visit Gillis Santa, MD [facility]  Showing today's visits and meeting all other requirements Future Appointments Date Type Provider Dept  04/28/22 Appointment Gillis Santa, MD [facility]  Showing future appointments within next 90 days and meeting all other requirements  Disposition: Discharge home  Discharge (Date  Time): 03/09/2022; 1112 hrs.   Primary Care Physician: Erica Haven, MD Location: [facility] Note by: Gillis Santa, MD Date: 03/09/2022; Time: 2:06 PM  Disclaimer:  Medicine is not an exact science. The only guarantee in medicine is that nothing is guaranteed. It is important to note that the decision to proceed with this intervention was based on the information collected from the patient. The Data and conclusions were drawn from the patient's questionnaire, the interview, and the physical examination. Because the information was provided in large part by the patient, it cannot be guaranteed that it has not been purposely or unconsciously manipulated. Every  effort has been made to obtain as much relevant  data as possible for this evaluation. It is important to note that the conclusions that lead to this procedure are derived in large part from the available data. Always take into account that the treatment will also be dependent on availability of resources and existing treatment guidelines, considered by other Pain Management Practitioners as being common knowledge and practice, at the time of the intervention. For Medico-Legal purposes, it is also important to point out that variation in procedural techniques and pharmacological choices are the acceptable norm. The indications, contraindications, technique, and results of the above procedure should only be interpreted and judged by a Board-Certified Interventional Pain Specialist with extensive familiarity and expertise in the same exact procedure and technique.

## 2022-03-10 ENCOUNTER — Telehealth: Payer: Self-pay

## 2022-03-10 NOTE — Telephone Encounter (Signed)
Post procedure phone call.  Patient states she is doing ok.  States she had some pressure last night but it seems to have subsided today and she walked without pain.  INstructed to call us for any questions or concerns.

## 2022-03-13 ENCOUNTER — Ambulatory Visit (INDEPENDENT_AMBULATORY_CARE_PROVIDER_SITE_OTHER): Payer: BC Managed Care – PPO

## 2022-03-13 ENCOUNTER — Other Ambulatory Visit: Payer: BC Managed Care – PPO

## 2022-03-13 DIAGNOSIS — J189 Pneumonia, unspecified organism: Secondary | ICD-10-CM

## 2022-04-04 ENCOUNTER — Encounter: Payer: Self-pay | Admitting: Student in an Organized Health Care Education/Training Program

## 2022-04-08 ENCOUNTER — Encounter: Payer: Self-pay | Admitting: Student in an Organized Health Care Education/Training Program

## 2022-04-09 ENCOUNTER — Encounter: Payer: Self-pay | Admitting: Student in an Organized Health Care Education/Training Program

## 2022-04-09 ENCOUNTER — Ambulatory Visit
Payer: BC Managed Care – PPO | Attending: Student in an Organized Health Care Education/Training Program | Admitting: Student in an Organized Health Care Education/Training Program

## 2022-04-09 DIAGNOSIS — M5416 Radiculopathy, lumbar region: Secondary | ICD-10-CM | POA: Diagnosis not present

## 2022-04-09 DIAGNOSIS — M1712 Unilateral primary osteoarthritis, left knee: Secondary | ICD-10-CM | POA: Diagnosis not present

## 2022-04-09 DIAGNOSIS — M25562 Pain in left knee: Secondary | ICD-10-CM

## 2022-04-09 DIAGNOSIS — G8929 Other chronic pain: Secondary | ICD-10-CM | POA: Diagnosis not present

## 2022-04-09 MED ORDER — DICLOFENAC SODIUM 75 MG PO TBEC
75.0000 mg | DELAYED_RELEASE_TABLET | Freq: Two times a day (BID) | ORAL | 0 refills | Status: DC
Start: 2022-04-09 — End: 2022-06-11

## 2022-04-09 NOTE — Progress Notes (Signed)
Patient: Erica Cline  Service Category: E/M  Provider: Gillis Santa, MD  DOB: 17-Apr-1961  DOS: 04/09/2022  Location: Office  MRN: [id number]  Setting: Ambulatory outpatient  Referring Provider: Leone Haven, MD  Type: Established Patient  Specialty: Interventional Pain Management  PCP: Leone Haven, MD  Location: Remote location  Delivery: TeleHealth     Virtual Encounter - Pain Management PROVIDER NOTE: Information contained herein reflects review and annotations entered in association with encounter. Interpretation of such information and data should be left to medically-trained personnel. Information provided to patient can be located elsewhere in the medical record under "Patient Instructions". Document created using STT-dictation technology, any transcriptional errors that may result from process are unintentional.    Contact & Pharmacy Preferred: [phone] Home: [phone] (home) Mobile: [phone] (mobile) E-mail: [email or web address]  [id number] [place], [address] AT [address] [place] [address] Phone: [phone] Fax: [phone]   Pre-screening  Erica Cline offered "in-person" vs "virtual" encounter. She indicated preferring virtual for this encounter.   Reason COVID-19*  Social distancing based on CDC and AMA recommendations.   I contacted Erica Cline on 04/09/2022 via telephone.      I clearly identified myself as Gillis Santa, MD. I verified that I was speaking with the correct person using two identifiers (Name: Erica Cline, and date of birth: 03/11/1961).  Consent I sought verbal advanced consent from Erica Cline for virtual visit interactions. I informed Erica Cline of possible security and privacy concerns, risks, and limitations associated with providing "not-in-person" medical evaluation and management services. I also informed Erica Cline of the availability of "in-person"  appointments. Finally, I informed her that there would be a charge for the virtual visit and that she could be  personally, fully or partially, financially responsible for it. Erica Cline expressed understanding and agreed to proceed.   Historic Elements   Erica Cline is a 61 y.o. year old, female patient evaluated today after our last contact on 03/09/2022. Erica Cline  has a past medical history of Allergic, Anxiety, Arrhythmia, COVID, Dermatitis of eyelids of both eyes, Fatigue, GERD (gastroesophageal reflux disease), Hyperlipidemia, Low serum vitamin B12, Mobitz type 2 second degree heart block, and Sick sinus syndrome ([place]). She also  has a past surgical history that includes Abdominal hysterectomy (2005); Cholecystectomy (2003); and Pacemaker insertion (10-07-2011). Erica Cline has a current medication list which includes the following prescription(s): diclofenac and gabapentin. She  reports that she has quit smoking. She has never used smokeless tobacco. She reports current alcohol use. She reports that she does not use drugs. Erica Cline is allergic to latex.   HPI  Today, she is being contacted for follow-up evaluation  Patient has established with Dr. Caryl Bis.  She is attempting to lose weight.  She has lost 10 pounds by diet control and portion management.  She is taking a cruise next week to [place].  She is trying to develop a pain management plan in case she has a pain flare there.  She continues gabapentin 300 mg nightly.  She has found benefit with her previous lumbar epidural steroid injection at right L4-L5 along with her left knee genicular nerve block.  Recommend diclofenac as below as she can take as needed for increased pain.  I have instructed her to contact me after reach returns if she is having difficulty managing her pain.  Laboratory Chemistry Profile   Renal Lab  Results  Component Value Date   BUN 16 02/08/2022   CREATININE 0.71 02/08/2022   GFR 90.35 04/20/2012    GFRAA >60 10/05/2011   GFRNONAA >60 02/08/2022    Hepatic Lab Results  Component Value Date   AST 15 02/24/2013   ALT 15 02/24/2013   ALBUMIN 4.3 02/24/2013   ALKPHOS 96 02/24/2013    Electrolytes Lab Results  Component Value Date   NA 138 02/08/2022   K 3.9 02/08/2022   CL 107 02/08/2022   CALCIUM 9.6 02/08/2022    Bone No results found for: "VD25OH", "VD125OH2TOT", "TX7741SE3", "TR3202BX4", "25OHVITD1", "25OHVITD2", "25OHVITD3", "TESTOFREE", "TESTOSTERONE"  Inflammation (CRP: Acute Phase) (ESR: Chronic Phase) No results found for: "CRP", "ESRSEDRATE", "LATICACIDVEN"       Note: Above Lab results reviewed.  Imaging  DG Chest 2 View CLINICAL DATA:  Follow-up pneumonia  EXAM: CHEST - 2 VIEW  COMPARISON:  Chest radiograph February 08, 2022  FINDINGS: Multi lead pacer apparatus overlies the left hemithorax. Stable cardiac and mediastinal contours. No consolidative pulmonary opacities. No pleural effusion or pneumothorax.  IMPRESSION: No active cardiopulmonary disease.  Electronically Signed   By: Lovey Newcomer M.D.   On: 03/16/2022 12:52  Assessment  The primary encounter diagnosis was Lumbar radicular pain (right L4/5). Diagnoses of Primary osteoarthritis of left knee and Chronic pain of left knee were also pertinent to this visit.  Plan of Care    Erica Cline has a current medication list which includes the following long-term medication(s): gabapentin.  Pharmacotherapy (Medications Ordered): Meds ordered this encounter  Medications   diclofenac (VOLTAREN) 75 MG EC tablet    Sig: Take 1 tablet (75 mg total) by mouth 2 (two) times daily.    Dispense:  60 tablet    Refill:  0    Follow-up plan:   Return if symptoms worsen or fail to improve.     Right L4/5 ESI #1 07/14/21, #2 03/09/2022, left genicular nerve block 03/09/2022    Recent Visits Date Type Provider Dept  03/09/22 Procedure visit Gillis Santa, MD [facility]  02/03/22 Office  Visit Gillis Santa, MD [facility]  Showing recent visits within past 90 days and meeting all other requirements Today's Visits Date Type Provider Dept  04/09/22 Office Visit Gillis Santa, MD [facility]  Showing today's visits and meeting all other requirements Future Appointments Date Type Provider Dept  04/28/22 Appointment Gillis Santa, MD [facility]  Showing future appointments within next 90 days and meeting all other requirements  I discussed the assessment and treatment plan with the patient. The patient was provided an opportunity to ask questions and all were answered. The patient agreed with the plan and demonstrated an understanding of the instructions.  Patient advised to call back or seek an in-person evaluation if the symptoms or condition worsens.  Duration of encounter: 24minutes.  Note by: Gillis Santa, MD Date: 04/09/2022; Time: 3:33 PM

## 2022-04-13 ENCOUNTER — Encounter: Payer: Self-pay | Admitting: Family Medicine

## 2022-04-28 ENCOUNTER — Ambulatory Visit: Payer: BC Managed Care – PPO | Admitting: Student in an Organized Health Care Education/Training Program

## 2022-05-14 ENCOUNTER — Ambulatory Visit (INDEPENDENT_AMBULATORY_CARE_PROVIDER_SITE_OTHER): Payer: BC Managed Care – PPO

## 2022-05-14 DIAGNOSIS — I441 Atrioventricular block, second degree: Secondary | ICD-10-CM | POA: Diagnosis not present

## 2022-05-18 ENCOUNTER — Ambulatory Visit
Payer: BC Managed Care – PPO | Attending: Student in an Organized Health Care Education/Training Program | Admitting: Student in an Organized Health Care Education/Training Program

## 2022-05-18 ENCOUNTER — Encounter: Payer: Self-pay | Admitting: Student in an Organized Health Care Education/Training Program

## 2022-05-18 VITALS — BP 119/67 | HR 88 | Temp 97.0°F | Ht 66.0 in | Wt 204.0 lb

## 2022-05-18 DIAGNOSIS — M5416 Radiculopathy, lumbar region: Secondary | ICD-10-CM | POA: Diagnosis not present

## 2022-05-18 DIAGNOSIS — M25561 Pain in right knee: Secondary | ICD-10-CM

## 2022-05-18 DIAGNOSIS — M1712 Unilateral primary osteoarthritis, left knee: Secondary | ICD-10-CM | POA: Diagnosis not present

## 2022-05-18 DIAGNOSIS — G8929 Other chronic pain: Secondary | ICD-10-CM | POA: Diagnosis not present

## 2022-05-18 DIAGNOSIS — M25562 Pain in left knee: Secondary | ICD-10-CM

## 2022-05-18 NOTE — Progress Notes (Signed)
Safety precautions to be maintained throughout the outpatient stay will include: orient to surroundings, keep bed in low position, maintain call bell within reach at all times, provide assistance with transfer out of bed and ambulation.  

## 2022-05-18 NOTE — Progress Notes (Signed)
PROVIDER NOTE: Information contained herein reflects review and annotations entered in association with encounter. Interpretation of such information and data should be left to medically-trained personnel. Information provided to patient can be located elsewhere in the medical record under "Patient Instructions". Document created using STT-dictation technology, any transcriptional errors that may result from process are unintentional.    Patient: Erica Cline  Service Category: E/M  Provider: Gillis Santa, MD  DOB: 11-18-1960  DOS: 05/18/2022  Referring Provider: Leone Haven, MD  MRN: [id number]  Specialty: Interventional Pain Management  PCP: Erica Haven, MD  Type: Established Patient  Setting: Ambulatory outpatient    Location: Office  Delivery: Face-to-face     HPI  [name], a 61 y.o. year old female, is here today because of her Lumbar radicular pain [M54.16]. [name] primary complain today is Other (Right thigh pain) Last encounter: My last encounter with her was on 04/09/2022. Pertinent problems: [name] has Lumbar radicular pain (right L4/5); Chronic pain of right knee; and Primary osteoarthritis of left knee on their pertinent problem list. Pain Assessment: Severity of Chronic pain is reported as a 2 /10. Location: Other (Comment) (right thigh pain) Right/radiates down right leg to foot. Onset: More than a month ago. Quality: Dull. Timing: Constant. Modifying factor(s): knee brace, meds. Vitals:  height is $RemoveB'5\' 6"'ZQrWxMOn$  (1.676 m) and weight is 204 lb (92.5 kg). Her temporal temperature is 97 F (36.1 C) (abnormal). Her blood pressure is 119/67 and her pulse is 88. Her oxygen saturation is 95%.   Reason for encounter:   [name] has return from her trip to [place].  She states that it was a wonderful trip.  She walked quite a bit and had difficulty managing her knee pain, which was worse for the right knee.  She states that she ate well and gained approximately 10  pounds.  She is hoping to lose additional weight and has been dieting and practicing healthy eating habits.  She hopes to have an appointment with [facility] orthopedics to discuss options for her right knee.  She states that she will let me know if he recommends a right knee genicular radiofrequency ablation.  She states that she has been utilizing a right knee brace and that has been helpful.  ROS  Constitutional: Denies any fever or chills Gastrointestinal: No reported hemesis, hematochezia, vomiting, or acute GI distress Musculoskeletal:  Right knee and right thigh pain Neurological: No reported episodes of acute onset apraxia, aphasia, dysarthria, agnosia, amnesia, paralysis, loss of coordination, or loss of consciousness  Medication Review  diclofenac and gabapentin  History Review  Allergy: [name] is allergic to latex. Drug: [name]  reports no history of drug use. Alcohol:  reports current alcohol use. Tobacco:  reports that she has quit smoking. She has never used smokeless tobacco. Social: [name]  reports that she has quit smoking. She has never used smokeless tobacco. She reports current alcohol use. She reports that she does not use drugs. Medical:  has a past medical history of Allergic, Anxiety, Arrhythmia, COVID, Dermatitis of eyelids of both eyes, Fatigue, GERD (gastroesophageal reflux disease), Hyperlipidemia, Low serum vitamin B12, Mobitz type 2 second degree heart block, and Sick sinus syndrome ([place]). Surgical: [name]  has a past surgical history that includes Abdominal hysterectomy (2005); Cholecystectomy (2003); and Pacemaker insertion (10-07-2011). Family: family history includes Colon cancer in her maternal grandmother.  Laboratory Chemistry Profile   Renal Lab Results  Component Value Date  BUN 16 02/08/2022   CREATININE 0.71 02/08/2022   GFR 90.35 04/20/2012   GFRAA >60 10/05/2011   GFRNONAA >60 02/08/2022    Hepatic Lab Results  Component Value Date    AST 15 02/24/2013   ALT 15 02/24/2013   ALBUMIN 4.3 02/24/2013   ALKPHOS 96 02/24/2013    Electrolytes Lab Results  Component Value Date   NA 138 02/08/2022   K 3.9 02/08/2022   CL 107 02/08/2022   CALCIUM 9.6 02/08/2022    Bone No results found for: "VD25OH", "VD125OH2TOT", "FV4944HQ7", "RF1638GY6", "25OHVITD1", "25OHVITD2", "25OHVITD3", "TESTOFREE", "TESTOSTERONE"  Inflammation (CRP: Acute Phase) (ESR: Chronic Phase) No results found for: "CRP", "ESRSEDRATE", "LATICACIDVEN"       Note: Above Lab results reviewed.  Recent Imaging Review  DG Chest 2 View CLINICAL DATA:  Follow-up pneumonia  EXAM: CHEST - 2 VIEW  COMPARISON:  Chest radiograph February 08, 2022  FINDINGS: Multi lead pacer apparatus overlies the left hemithorax. Stable cardiac and mediastinal contours. No consolidative pulmonary opacities. No pleural effusion or pneumothorax.  IMPRESSION: No active cardiopulmonary disease.  Electronically Signed   By: Lovey Newcomer M.D.   On: 03/16/2022 12:52 Note: Reviewed        Physical Exam  General appearance: Well nourished, well developed, and well hydrated. In no apparent acute distress Mental status: Alert, oriented x 3 (person, place, & time)       Respiratory: No evidence of acute respiratory distress Eyes: PERLA Vitals: BP 119/67 (BP Location: Right Arm, Patient Position: Sitting, Cuff Size: Large)   Pulse 88   Temp (!) 97 F (36.1 C) (Temporal)   Ht $R'5\' 6"'RI$  (1.676 m)   Wt 204 lb (92.5 kg)   SpO2 95%   BMI 32.93 kg/m  BMI: Estimated body mass index is 32.93 kg/m as calculated from the following:   Height as of this encounter: $RemoveBeforeD'5\' 6"'IUyHasxfChvAgc$  (1.676 m).   Weight as of this encounter: 204 lb (92.5 kg). Ideal: Ideal body weight: 59.3 kg (130 lb 11.7 oz) Adjusted ideal body weight: 72.6 kg (160 lb 0.6 oz)  Pain in bilateral knees, right greater than left, worse with weightbearing 5 out of 5 strength bilateral lower extremity: Plantar flexion, dorsiflexion, knee  flexion, knee extension.    Assessment   Diagnosis Status  1. Lumbar radicular pain (right L4/5)   2. Chronic pain of right knee   3. Primary osteoarthritis of left knee   4. Chronic pain of left knee    Controlled Controlled Controlled   Updated Problems: Problem  Chronic Pain of Right Knee     Plan of Care  Encouraged patient to continue with dieting and weight loss strategies Offered genicular nerve RFA for left knee status post positive diagnostic genicular nerve block    Follow-up plan:   Return if symptoms worsen or fail to improve.     Right L4/5 ESI #1 07/14/21, #2 03/09/2022, left genicular nerve block 03/09/2022     Recent Visits Date Type Provider Dept  04/09/22 Office Visit Erica Santa, MD [facility]  03/09/22 Procedure visit Erica Santa, MD [facility]  Showing recent visits within past 90 days and meeting all other requirements Today's Visits Date Type Provider Dept  05/18/22 Office Visit Erica Santa, MD [facility]  Showing today's visits and meeting all other requirements Future Appointments No visits were found meeting these conditions. Showing future appointments within next 90 days and meeting all other requirements  I discussed the assessment and treatment plan  with the patient. The patient was provided an opportunity to ask questions and all were answered. The patient agreed with the plan and demonstrated an understanding of the instructions.  Patient advised to call back or seek an in-person evaluation if the symptoms or condition worsens.  Duration of encounter: 40minutes.  Total time on encounter, as per AMA guidelines included both the face-to-face and non-face-to-face time personally spent by the physician and/or other qualified health care professional(s) on the day of the encounter (includes time in activities that require the physician or other qualified health care professional and does not include  time in activities normally performed by clinical staff). Physician's time may include the following activities when performed: preparing to see the patient (eg, review of tests, pre-charting review of records) obtaining and/or reviewing separately obtained history performing a medically appropriate examination and/or evaluation counseling and educating the patient/family/caregiver ordering medications, tests, or procedures referring and communicating with other health care professionals (when not separately reported) documenting clinical information in the electronic or other health record independently interpreting results (not separately reported) and communicating results to the patient/ family/caregiver care coordination (not separately reported)  Note by: Erica Santa, MD Date: 05/18/2022; Time: 3:02 PM

## 2022-05-19 LAB — CUP PACEART REMOTE DEVICE CHECK
Battery Impedance: 2828 Ohm
Battery Remaining Longevity: 25 mo
Battery Voltage: 2.74 V
Brady Statistic AP VP Percent: 0 %
Brady Statistic AP VS Percent: 11 %
Brady Statistic AS VP Percent: 0 %
Brady Statistic AS VS Percent: 89 %
Date Time Interrogation Session: 20231023134941
Implantable Pulse Generator Implant Date: 20130313
Lead Channel Impedance Value: 427 Ohm
Lead Channel Impedance Value: 472 Ohm
Lead Channel Pacing Threshold Amplitude: 0.375 V
Lead Channel Pacing Threshold Amplitude: 1.375 V
Lead Channel Pacing Threshold Pulse Width: 0.4 ms
Lead Channel Pacing Threshold Pulse Width: 0.4 ms
Lead Channel Setting Pacing Amplitude: 1.5 V
Lead Channel Setting Pacing Amplitude: 2.75 V
Lead Channel Setting Pacing Pulse Width: 0.4 ms
Lead Channel Setting Sensing Sensitivity: 5.6 mV
Zone Setting Status: 755011
Zone Setting Status: 755011

## 2022-05-26 NOTE — Progress Notes (Signed)
Remote pacemaker transmission.   

## 2022-06-03 ENCOUNTER — Ambulatory Visit: Payer: BC Managed Care – PPO | Admitting: Family Medicine

## 2022-06-03 NOTE — Telephone Encounter (Signed)
Lvm for the patient to call back and reschedule her appointment from today to 11/16 due to the provider being out of work.  Trew Sunde,cma

## 2022-06-11 ENCOUNTER — Encounter: Payer: Self-pay | Admitting: Family Medicine

## 2022-06-11 ENCOUNTER — Ambulatory Visit (INDEPENDENT_AMBULATORY_CARE_PROVIDER_SITE_OTHER): Payer: BC Managed Care – PPO | Admitting: Family Medicine

## 2022-06-11 VITALS — BP 115/70 | HR 76 | Temp 98.6°F | Ht 66.0 in | Wt 216.2 lb

## 2022-06-11 DIAGNOSIS — E669 Obesity, unspecified: Secondary | ICD-10-CM

## 2022-06-11 NOTE — Progress Notes (Signed)
  Tommi Rumps, MD Phone: [phone]  Erica Cline is a 61 y.o. female who presents today for follow-up.  Obesity: Patient notes she has made quite a bit of progress with the Mediterranean diet.  She is not as hungry while on this diet.  She has significantly reduced her carbohydrate intake.  She notes this is all help with joint pain and weight loss.  She been walking some for exercise and is going to get a bike for Christmas.  She notes her weight at home is down to 203 pounds.  Social History   Tobacco Use  Smoking Status Former  Smokeless Tobacco Never    Current Outpatient Medications on File Prior to Visit  Medication Sig Dispense Refill   gabapentin (NEURONTIN) 300 MG capsule Take 1-2 capsules (300-600 mg total) by mouth at bedtime. 90 capsule 2   No current facility-administered medications on file prior to visit.     ROS see history of present illness  Objective  Physical Exam Vitals:   06/11/22 1611  BP: 115/70  Pulse: 76  Temp: 98.6 F (37 C)  SpO2: 97%    BP Readings from Last 3 Encounters:  06/11/22 115/70  05/18/22 119/67  03/09/22 110/68   Wt Readings from Last 3 Encounters:  06/11/22 216 lb 3.2 oz (98.1 kg)  05/18/22 204 lb (92.5 kg)  03/09/22 220 lb (99.8 kg)    Physical Exam Constitutional:      General: She is not in acute distress.    Appearance: She is not diaphoretic.  Cardiovascular:     Rate and Rhythm: Normal rate and regular rhythm.     Heart sounds: Normal heart sounds.  Pulmonary:     Effort: Pulmonary effort is normal.     Breath sounds: Normal breath sounds.  Neurological:     Mental Status: She is alert.      Assessment/Plan: Please see individual problem list.  Problem List Items Addressed This Visit     Obesity, Class II, BMI 35-39.9, no comorbidity - Primary (Chronic)    Patient has lost adequate weight since her last visit.  I think our weight reading in the office today is inaccurate given her home  readings and the last reading at a different doctor's office.  She will continue to work on diet and exercise.       Return in about 6 months (around 12/10/2022) for cpe.   Tommi Rumps, MD [place]

## 2022-06-11 NOTE — Assessment & Plan Note (Signed)
Patient has lost adequate weight since her last visit.  I think our weight reading in the office today is inaccurate given her home readings and the last reading at a different doctor's office.  She will continue to work on diet and exercise.

## 2022-07-28 DIAGNOSIS — M25562 Pain in left knee: Secondary | ICD-10-CM | POA: Diagnosis not present

## 2022-07-28 DIAGNOSIS — M1712 Unilateral primary osteoarthritis, left knee: Secondary | ICD-10-CM | POA: Diagnosis not present

## 2022-07-28 DIAGNOSIS — G8929 Other chronic pain: Secondary | ICD-10-CM | POA: Diagnosis not present

## 2022-08-13 ENCOUNTER — Ambulatory Visit: Payer: BC Managed Care – PPO | Attending: Cardiology

## 2022-08-13 DIAGNOSIS — I441 Atrioventricular block, second degree: Secondary | ICD-10-CM | POA: Diagnosis not present

## 2022-08-18 LAB — CUP PACEART REMOTE DEVICE CHECK
Battery Impedance: 2907 Ohm
Battery Remaining Longevity: 24 mo
Battery Voltage: 2.73 V
Brady Statistic AP VP Percent: 0 %
Brady Statistic AP VS Percent: 12 %
Brady Statistic AS VP Percent: 0 %
Brady Statistic AS VS Percent: 88 %
Date Time Interrogation Session: 20240122161717
Implantable Pulse Generator Implant Date: 20130313
Lead Channel Impedance Value: 432 Ohm
Lead Channel Impedance Value: 486 Ohm
Lead Channel Pacing Threshold Amplitude: 0.375 V
Lead Channel Pacing Threshold Amplitude: 1.5 V
Lead Channel Pacing Threshold Pulse Width: 0.4 ms
Lead Channel Pacing Threshold Pulse Width: 0.4 ms
Lead Channel Setting Pacing Amplitude: 1.5 V
Lead Channel Setting Pacing Amplitude: 3 V
Lead Channel Setting Pacing Pulse Width: 0.4 ms
Lead Channel Setting Sensing Sensitivity: 5.6 mV
Zone Setting Status: 755011
Zone Setting Status: 755011

## 2022-09-03 NOTE — Progress Notes (Signed)
Remote pacemaker transmission.   

## 2022-09-11 ENCOUNTER — Encounter: Payer: Self-pay | Admitting: Cardiology

## 2022-09-11 ENCOUNTER — Ambulatory Visit (INDEPENDENT_AMBULATORY_CARE_PROVIDER_SITE_OTHER): Payer: BC Managed Care – PPO | Admitting: Family Medicine

## 2022-09-11 ENCOUNTER — Encounter: Payer: Self-pay | Admitting: Family Medicine

## 2022-09-11 VITALS — BP 118/76 | HR 75 | Temp 97.9°F | Resp 16 | Ht 65.75 in | Wt 223.2 lb

## 2022-09-11 DIAGNOSIS — Z01818 Encounter for other preprocedural examination: Secondary | ICD-10-CM | POA: Diagnosis not present

## 2022-09-11 DIAGNOSIS — Z0000 Encounter for general adult medical examination without abnormal findings: Secondary | ICD-10-CM | POA: Diagnosis not present

## 2022-09-11 NOTE — Assessment & Plan Note (Addendum)
Medically the patient is low risk.  Will check a CBC and a BMP to determine if there is anything that would keep her from having the surgery.  I did encourage her to contact her cardiologist to get their input on her ability to proceed with the surgery.  I advised her form would be filled out once her lab work returns.

## 2022-09-11 NOTE — Assessment & Plan Note (Signed)
Physical exam completed.  I encouraged healthy diet and exercise.  She will schedule her mammogram.  Colon cancer screening is up-to-date.  She declines hepatitis C and HIV screening.  She declines further COVID vaccinations.  She will consider getting the Shingrix vaccine and RSV vaccine when she knows the date that would work best for her.

## 2022-09-11 NOTE — Progress Notes (Signed)
Tommi Rumps, MD Phone: [phone]  Erica Cline is a 62 y.o. female who presents today for CPE.  Diet: slightly more unhealthy since christmas though still somewhat following the mediterranean diet.  Exercise: walking some Pap smear: s/p hysterectomy, notes GYN advised her she did not need any further pap smears Colonoscopy: 09/25/19, 10 year recall Mammogram: 09/18/21 Family history-  Colon cancer: maternal grandmother  Breast cancer: no  Ovarian cancer: no Menses: hysterectomy Vaccines-   Flu: UTD  Tetanus: UTD  Shingles: due  COVID19: x2  RSV: due HIV screening: declines Hep C Screening: declines Tobacco use: no Alcohol use: rare Illicit Drug use: no Dentist: yes Ophthalmology: yes  Patient is going to have a left knee replacement.  She is here for surgical risk assessment.  She has not discussed this with her cardiologist yet.   Active Ambulatory Problems    Diagnosis Date Noted   Mobitz type 2 second degree heart block    Lumbar radicular pain (right L4/5) 07/09/2021   Chronic pain of right knee 07/09/2021   Primary osteoarthritis of left knee 07/09/2021   Presence of cardiac pacemaker 01/30/2014   Osteopenia 12/08/2017   Obesity, Class II, BMI 35-39.9, no comorbidity 06/19/2019   Mitral valve prolapse 01/30/2014   Low serum vitamin B12 12/08/2017   Community acquired pneumonia 03/02/2022   Routine general medical examination at a health care facility 09/11/2022   Preoperative examination 09/11/2022   Resolved Ambulatory Problems    Diagnosis Date Noted   Adjustment reaction with anxiety and depression 02/24/2013   Post-menopausal 12/09/2018   Palpitations 01/30/2014   Overweight 12/08/2017   Microscopic hematuria 06/10/2018   Internal hemorrhoids 09/25/2019   Flank pain 06/19/2019   Fever in adult 09/19/2020   Syncope 01/30/2014   Past Medical History:  Diagnosis Date   Allergic    Anxiety    Arrhythmia    COVID    Dermatitis of eyelids  of both eyes    Fatigue    GERD (gastroesophageal reflux disease)    Hyperlipidemia    Sick sinus syndrome (HCC)     Family History  Problem Relation Age of Onset   Colon cancer Maternal Grandmother     Social History   Socioeconomic History   Marital status: Married    Spouse name: Not on file   Number of children: Not on file   Years of education: Not on file   Highest education level: Not on file  Occupational History   Not on file  Tobacco Use   Smoking status: Former   Smokeless tobacco: Never  Substance and Sexual Activity   Alcohol use: Yes    Comment: 1 glass of wine 2 times monthly   Drug use: No   Sexual activity: Not on file  Other Topics Concern   Not on file  Social History Narrative   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   3 children, married.   Mother is Frankey Poot.   Social Determinants of Health   Financial Resource Strain: Not on file  Food Insecurity: Not on file  Transportation Needs: Not on file  Physical Activity: Not on file  Stress: Not on file  Social Connections: Not on file  Intimate Partner Violence: Not on file    ROS  General:  Negative for nexplained weight loss, fever Skin: Negative for new or changing mole, sore that won't heal HEENT: Negative for trouble hearing, trouble seeing, ringing in ears, mouth sores, hoarseness, change in voice, dysphagia. CV:  Negative for chest pain, dyspnea, edema, palpitations Resp: Negative for cough, dyspnea, hemoptysis GI: Negative for nausea, vomiting, diarrhea, constipation, abdominal pain, melena, hematochezia. GU: Negative for dysuria, incontinence, urinary hesitance, hematuria, vaginal or penile discharge, polyuria, sexual difficulty, lumps in testicle or breasts MSK: Negative for muscle cramps or aches, joint pain or swelling Neuro: Negative for headaches, weakness, numbness, dizziness, passing out/fainting Psych: Negative for depression, anxiety, memory  problems  Objective  Physical Exam Vitals:   09/11/22 1533  BP: 118/76  Pulse: 75  Resp: 16  Temp: 97.9 F (36.6 C)  SpO2: 99%    BP Readings from Last 3 Encounters:  09/11/22 118/76  06/11/22 115/70  05/18/22 119/67   Wt Readings from Last 3 Encounters:  09/11/22 223 lb 4 oz (101.3 kg)  06/11/22 216 lb 3.2 oz (98.1 kg)  05/18/22 204 lb (92.5 kg)    Physical Exam Constitutional:      General: She is not in acute distress.    Appearance: She is not diaphoretic.  HENT:     Head: Normocephalic and atraumatic.  Cardiovascular:     Rate and Rhythm: Normal rate and regular rhythm.     Heart sounds: Normal heart sounds.  Pulmonary:     Effort: Pulmonary effort is normal.     Breath sounds: Normal breath sounds.  Abdominal:     General: Bowel sounds are normal. There is no distension.     Palpations: Abdomen is soft.     Tenderness: There is no abdominal tenderness.  Musculoskeletal:     Right lower leg: No edema.     Left lower leg: No edema.  Lymphadenopathy:     Cervical: No cervical adenopathy.  Skin:    General: Skin is warm and dry.  Neurological:     Mental Status: She is alert.  Psychiatric:        Mood and Affect: Mood normal.      Assessment/Plan:   Routine general medical examination at a health care facility Assessment & Plan: Physical exam completed.  I encouraged healthy diet and exercise.  She will schedule her mammogram.  Colon cancer screening is up-to-date.  She declines hepatitis C and HIV screening.  She declines further COVID vaccinations.  She will consider getting the Shingrix vaccine and RSV vaccine when she knows the date that would work best for her.   Preoperative examination Assessment & Plan: Medically the patient is low risk.  Will check a CBC and a BMP to determine if there is anything that would keep her from having the surgery.  I did encourage her to contact her cardiologist to get their input on her ability to proceed with  the surgery.  I advised her form would be filled out once her lab work returns.  Orders: -     Basic metabolic panel -     CBC    Return in about 1 year (around 09/12/2023) for physical.   Tommi Rumps, MD [place]

## 2022-09-12 LAB — CBC
HCT: 39.3 % (ref 35.0–45.0)
Hemoglobin: 13.2 g/dL (ref 11.7–15.5)
MCH: 29 pg (ref 27.0–33.0)
MCHC: 33.6 g/dL (ref 32.0–36.0)
MCV: 86.4 fL (ref 80.0–100.0)
MPV: 10.6 fL (ref 7.5–12.5)
Platelets: 281 10*3/uL (ref 140–400)
RBC: 4.55 10*6/uL (ref 3.80–5.10)
RDW: 11.4 % (ref 11.0–15.0)
WBC: 6.9 10*3/uL (ref 3.8–10.8)

## 2022-09-12 LAB — BASIC METABOLIC PANEL
BUN: 21 mg/dL (ref 7–25)
CO2: 27 mmol/L (ref 20–32)
Calcium: 10.2 mg/dL (ref 8.6–10.4)
Chloride: 107 mmol/L (ref 98–110)
Creat: 0.66 mg/dL (ref 0.50–1.05)
Glucose, Bld: 89 mg/dL (ref 65–99)
Potassium: 4.2 mmol/L (ref 3.5–5.3)
Sodium: 142 mmol/L (ref 135–146)

## 2022-09-15 ENCOUNTER — Telehealth: Payer: Self-pay | Admitting: *Deleted

## 2022-09-15 NOTE — Telephone Encounter (Signed)
   Pre-operative Risk Assessment    Patient Name: Erica Cline  DOB: 11-Jul-1961 MRN: [id number]      Request for Surgical Clearance    Procedure:   LEFT TOTAL KNEE ARTHROPLASTY  Date of Surgery:  Clearance TBD                                Surgeon:  DR. Janese Banks Surgeon's Group or Practice Name:  [facility] Phone number:  [phone] Fax number:  [phone]   Type of Clearance Requested:   - Medical ; NO MEDICATIONS LISTED AS NEEDING TO BE HELD   Type of Anesthesia:  Not Indicated; LEFT MESSAGE TO CALL BACK WITH TYPE OF ANESTHESIA   Additional requests/questions:    Erica Cline   09/15/2022, 2:51 PM

## 2022-09-15 NOTE — Telephone Encounter (Signed)
I will remove from the call back pool until the pt calls back to schedule appt.

## 2022-09-15 NOTE — Telephone Encounter (Signed)
Primary Cardiologist: Dr. Quentin Ore   Preoperative team, please contact this patient and set up a phone call appointment for further preoperative risk assessment. Please obtain consent and complete medication review. Thank you for your help.   I confirm that guidance regarding antiplatelet and oral anticoagulation therapy has been completed and, if necessary, noted below (none requested).   Emmaline Life, NP-C  09/15/2022, 3:07 PM [address], [address] Office [phone] Fax [phone]

## 2022-09-15 NOTE — Telephone Encounter (Signed)
I s/w the pt and she tells me that her surgery may be several months out. She has appt coming up with the surgeon's office this month and she will call back to schedule tele appt once she a better understanding when surgery may be done.   Pt is also due for her annual f/u with Dr. Quentin Ore as well. I will send FYI to requesting office.

## 2022-09-16 NOTE — Telephone Encounter (Signed)
[facility] called back today with needed missing information for pre op clearance.   ADDENDUM: SPINAL WITH BLOCK   SEE NOTES WHERE I S/W THE PT YESTERDAY WHO SAYS SHE DOES NOT WANT TO DO APPT NOW AS SHE STATES HER SURGERY IS NOT GOING TO BE FOR A FEW MONTHS. PT STATES SHE WILL CALL BACK AFTER SHE TALKS TO SURGEON. I WILL SEND FYI TO SURGEON OFFICE.

## 2022-09-21 DIAGNOSIS — M1712 Unilateral primary osteoarthritis, left knee: Secondary | ICD-10-CM | POA: Diagnosis not present

## 2022-10-02 ENCOUNTER — Other Ambulatory Visit: Payer: Self-pay | Admitting: Student in an Organized Health Care Education/Training Program

## 2022-10-04 ENCOUNTER — Other Ambulatory Visit: Payer: Self-pay | Admitting: Student in an Organized Health Care Education/Training Program

## 2022-10-07 ENCOUNTER — Other Ambulatory Visit: Payer: Self-pay | Admitting: *Deleted

## 2022-10-07 ENCOUNTER — Telehealth: Payer: Self-pay | Admitting: Student in an Organized Health Care Education/Training Program

## 2022-10-07 MED ORDER — GABAPENTIN 300 MG PO CAPS: 300.0000 mg | ORAL_CAPSULE | Freq: Every day | ORAL | 2 refills | Status: DC

## 2022-10-07 NOTE — Telephone Encounter (Signed)
Vinnie Level will like for a nurse to give her a call at [phone].TY

## 2022-10-07 NOTE — Telephone Encounter (Signed)
Spoke with Vinnie Level, she asked about patient getting a new script for Gabapentin. I informed her it was sent today, she confirmed it is at the pharmacy.

## 2022-10-07 NOTE — Telephone Encounter (Signed)
Patient aware.

## 2022-10-07 NOTE — Telephone Encounter (Signed)
Medication refill request sent.  

## 2022-10-07 NOTE — Telephone Encounter (Signed)
PT stated that she is out of her Gabapentin needs refill sent to [facility]. Please give patient a call once prescription has been send in. TY

## 2022-11-06 ENCOUNTER — Encounter: Payer: Self-pay | Admitting: Family Medicine

## 2022-11-12 ENCOUNTER — Ambulatory Visit (INDEPENDENT_AMBULATORY_CARE_PROVIDER_SITE_OTHER): Payer: BC Managed Care – PPO

## 2022-11-12 DIAGNOSIS — I441 Atrioventricular block, second degree: Secondary | ICD-10-CM

## 2022-11-13 LAB — CUP PACEART REMOTE DEVICE CHECK
Battery Impedance: 3048 Ohm
Battery Remaining Longevity: 22 mo
Battery Voltage: 2.72 V
Brady Statistic AP VP Percent: 0 %
Brady Statistic AP VS Percent: 12 %
Brady Statistic AS VP Percent: 0 %
Brady Statistic AS VS Percent: 88 %
Date Time Interrogation Session: 20240418160124
Implantable Pulse Generator Implant Date: 20130313
Lead Channel Impedance Value: 470 Ohm
Lead Channel Impedance Value: 508 Ohm
Lead Channel Pacing Threshold Amplitude: 0.375 V
Lead Channel Pacing Threshold Amplitude: 1.625 V
Lead Channel Pacing Threshold Pulse Width: 0.4 ms
Lead Channel Pacing Threshold Pulse Width: 0.4 ms
Lead Channel Setting Pacing Amplitude: 1.5 V
Lead Channel Setting Pacing Amplitude: 3.25 V
Lead Channel Setting Pacing Pulse Width: 0.4 ms
Lead Channel Setting Sensing Sensitivity: 5.6 mV
Zone Setting Status: 755011
Zone Setting Status: 755011

## 2022-12-04 ENCOUNTER — Other Ambulatory Visit: Payer: Self-pay | Admitting: *Deleted

## 2022-12-14 NOTE — Progress Notes (Signed)
Remote pacemaker transmission.   

## 2022-12-29 NOTE — Progress Notes (Unsigned)
  Electrophysiology Office Follow up Visit Note:    Date:  12/30/2022   ID:  Erica Cline, DOB 1960/10/19, MRN [id number]  PCP:  Glori Luis, MD  [facility] Cardiologist:  None  [facility] Electrophysiologist:  Lanier Prude, MD    Interval History:    Erica Cline is a 62 y.o. female who presents for a follow up visit.   Last saw the patient October 15, 2021.  She has a permanent pacemaker that was implanted for sick sinus syndrome in 2013.  Most recent device interrogation showed stable device function. She is recently changed to the Mediterranean diet and has lost weight and feels generally better.  Less inflammation.  Her husband is eating on the Mediterranean diet as well.      Past medical, surgical, social and family history were reviewed.  ROS:   Please see the history of present illness.    All other systems reviewed and are negative.  EKGs/Labs/Other Studies Reviewed:    The following studies were reviewed today:  December 30, 2022 in clinic device interrogation personally reviewed Battery longevity 19 months Lead parameter stable No changes made today     Physical Exam:    VS:  BP 132/76   Pulse 76   Ht 5\' 6"  (1.676 m)   Wt 216 lb (98 kg)   SpO2 98%   BMI 34.86 kg/m     Wt Readings from Last 3 Encounters:  12/30/22 216 lb (98 kg)  09/11/22 223 lb 4 oz (101.3 kg)  06/11/22 216 lb 3.2 oz (98.1 kg)     GEN:  Well nourished, well developed in no acute distress CARDIAC: RRR, no murmurs, rubs, gallops.  Pacemaker pocket well-healed RESPIRATORY:  Clear to auscultation without rales, wheezing or rhonchi       ASSESSMENT:    1. Mobitz type 2 second degree heart block   2. SSS (sick sinus syndrome) (HCC)   3. Pacemaker    PLAN:    In order of problems listed above:   #Sick sinus syndrome #Permanent pacemaker in situ Device functioning appropriately.  Continue remote monitoring. We did discuss generator replacement during  today's clinic appointment.  I will see her in 12 months and we will discuss further.   Follow-up 1 year.    Signed, Steffanie Dunn, MD, [facility], [facility] 12/30/2022 4:36 PM    Electrophysiology [facility]

## 2022-12-30 ENCOUNTER — Encounter: Payer: Self-pay | Admitting: Cardiology

## 2022-12-30 ENCOUNTER — Ambulatory Visit: Payer: BC Managed Care – PPO | Attending: Cardiology | Admitting: Cardiology

## 2022-12-30 VITALS — BP 132/76 | HR 76 | Ht 66.0 in | Wt 216.0 lb

## 2022-12-30 DIAGNOSIS — Z950 Presence of cardiac pacemaker: Secondary | ICD-10-CM

## 2022-12-30 DIAGNOSIS — I441 Atrioventricular block, second degree: Secondary | ICD-10-CM | POA: Diagnosis not present

## 2022-12-30 DIAGNOSIS — I495 Sick sinus syndrome: Secondary | ICD-10-CM | POA: Diagnosis not present

## 2022-12-30 LAB — CUP PACEART INCLINIC DEVICE CHECK
Battery Impedance: 3285 Ohm
Battery Remaining Longevity: 20 mo
Battery Voltage: 2.72 V
Brady Statistic AP VP Percent: 0 %
Brady Statistic AP VS Percent: 12 %
Brady Statistic AS VP Percent: 0 %
Brady Statistic AS VS Percent: 88 %
Date Time Interrogation Session: 20240605162121
Implantable Pulse Generator Implant Date: 20130313
Lead Channel Impedance Value: 418 Ohm
Lead Channel Impedance Value: 427 Ohm
Lead Channel Pacing Threshold Amplitude: 0.375 V
Lead Channel Pacing Threshold Amplitude: 1.5 V
Lead Channel Pacing Threshold Pulse Width: 0.4 ms
Lead Channel Pacing Threshold Pulse Width: 0.4 ms
Lead Channel Sensing Intrinsic Amplitude: 1.4 mV
Lead Channel Sensing Intrinsic Amplitude: 11.2 mV
Lead Channel Setting Pacing Amplitude: 1.5 V
Lead Channel Setting Pacing Amplitude: 3 V
Lead Channel Setting Pacing Pulse Width: 0.4 ms
Lead Channel Setting Sensing Sensitivity: 4 mV
Zone Setting Status: 755011
Zone Setting Status: 755011

## 2022-12-30 NOTE — Patient Instructions (Signed)
Medication Instructions:  Your physician recommends that you continue on your current medications as directed. Please refer to the Current Medication list given to you today.  *If you need a refill on your cardiac medications before your next appointment, please call your pharmacy*  Follow-Up: At [facility], you and your health needs are our priority.  As part of our continuing mission to provide you with exceptional heart care, we have created designated Provider Care Teams.  These Care Teams include your primary Cardiologist (physician) and Advanced Practice Providers (APPs -  Physician Assistants and Nurse Practitioners) who all work together to provide you with the care you need, when you need it.  Your next appointment:   1 year(s)  Provider:   Cameron Lambert, MD    

## 2023-02-11 ENCOUNTER — Ambulatory Visit (INDEPENDENT_AMBULATORY_CARE_PROVIDER_SITE_OTHER): Payer: BC Managed Care – PPO

## 2023-02-11 DIAGNOSIS — I495 Sick sinus syndrome: Secondary | ICD-10-CM

## 2023-02-16 LAB — CUP PACEART REMOTE DEVICE CHECK
Battery Impedance: 3583 Ohm
Battery Remaining Longevity: 17 mo
Battery Voltage: 2.7 V
Brady Statistic AP VP Percent: 0 %
Brady Statistic AP VS Percent: 15 %
Brady Statistic AS VP Percent: 0 %
Brady Statistic AS VS Percent: 85 %
Date Time Interrogation Session: 20240718233549
Implantable Lead Connection Status: 753985
Implantable Lead Connection Status: 753985
Implantable Lead Implant Date: 20130313
Implantable Lead Implant Date: 20130313
Implantable Lead Location: 753859
Implantable Lead Location: 753860
Implantable Lead Model: 5092
Implantable Lead Model: 5594
Implantable Pulse Generator Implant Date: 20130313
Lead Channel Impedance Value: 417 Ohm
Lead Channel Impedance Value: 461 Ohm
Lead Channel Pacing Threshold Amplitude: 0.375 V
Lead Channel Pacing Threshold Amplitude: 1.5 V
Lead Channel Pacing Threshold Pulse Width: 0.4 ms
Lead Channel Pacing Threshold Pulse Width: 0.4 ms
Lead Channel Setting Pacing Amplitude: 1.5 V
Lead Channel Setting Pacing Amplitude: 3 V
Lead Channel Setting Pacing Pulse Width: 0.4 ms
Lead Channel Setting Sensing Sensitivity: 4 mV
Zone Setting Status: 755011
Zone Setting Status: 755011

## 2023-02-25 NOTE — Progress Notes (Signed)
Remote pacemaker transmission.   

## 2023-03-13 ENCOUNTER — Other Ambulatory Visit: Payer: Self-pay | Admitting: Student in an Organized Health Care Education/Training Program

## 2023-03-16 ENCOUNTER — Other Ambulatory Visit: Payer: Self-pay | Admitting: Student in an Organized Health Care Education/Training Program

## 2023-03-16 MED ORDER — GABAPENTIN 300 MG PO CAPS: 300.0000 mg | ORAL_CAPSULE | Freq: Every day | ORAL | 2 refills | Status: DC

## 2023-03-23 DIAGNOSIS — Z1231 Encounter for screening mammogram for malignant neoplasm of breast: Secondary | ICD-10-CM | POA: Diagnosis not present

## 2023-03-23 LAB — HM MAMMOGRAPHY

## 2023-05-13 ENCOUNTER — Ambulatory Visit (INDEPENDENT_AMBULATORY_CARE_PROVIDER_SITE_OTHER): Payer: BC Managed Care – PPO

## 2023-05-13 DIAGNOSIS — I495 Sick sinus syndrome: Secondary | ICD-10-CM

## 2023-05-14 LAB — CUP PACEART REMOTE DEVICE CHECK
Battery Impedance: 3947 Ohm
Battery Remaining Longevity: 13 mo
Battery Voltage: 2.7 V
Brady Statistic AP VP Percent: 0 %
Brady Statistic AP VS Percent: 16 %
Brady Statistic AS VP Percent: 0 %
Brady Statistic AS VS Percent: 84 %
Date Time Interrogation Session: 20241018123650
Implantable Lead Connection Status: 753985
Implantable Lead Connection Status: 753985
Implantable Lead Implant Date: 20130313
Implantable Lead Implant Date: 20130313
Implantable Lead Location: 753859
Implantable Lead Location: 753860
Implantable Lead Model: 5092
Implantable Lead Model: 5594
Implantable Pulse Generator Implant Date: 20130313
Lead Channel Impedance Value: 438 Ohm
Lead Channel Impedance Value: 470 Ohm
Lead Channel Pacing Threshold Amplitude: 0.375 V
Lead Channel Pacing Threshold Amplitude: 1.625 V
Lead Channel Pacing Threshold Pulse Width: 0.4 ms
Lead Channel Pacing Threshold Pulse Width: 0.4 ms
Lead Channel Setting Pacing Amplitude: 1.5 V
Lead Channel Setting Pacing Amplitude: 3.25 V
Lead Channel Setting Pacing Pulse Width: 0.4 ms
Lead Channel Setting Sensing Sensitivity: 4 mV
Zone Setting Status: 755011
Zone Setting Status: 755011

## 2023-06-02 NOTE — Progress Notes (Signed)
Remote pacemaker transmission.   

## 2023-08-10 ENCOUNTER — Encounter: Payer: Self-pay | Admitting: Student in an Organized Health Care Education/Training Program

## 2023-08-10 ENCOUNTER — Ambulatory Visit
Payer: BC Managed Care – PPO | Attending: Student in an Organized Health Care Education/Training Program | Admitting: Student in an Organized Health Care Education/Training Program

## 2023-08-10 VITALS — BP 91/55 | HR 80 | Temp 97.2°F | Resp 16 | Ht 66.0 in | Wt 211.0 lb

## 2023-08-10 DIAGNOSIS — G8929 Other chronic pain: Secondary | ICD-10-CM | POA: Diagnosis not present

## 2023-08-10 DIAGNOSIS — M1712 Unilateral primary osteoarthritis, left knee: Secondary | ICD-10-CM | POA: Insufficient documentation

## 2023-08-10 DIAGNOSIS — M5416 Radiculopathy, lumbar region: Secondary | ICD-10-CM | POA: Insufficient documentation

## 2023-08-10 DIAGNOSIS — M25561 Pain in right knee: Secondary | ICD-10-CM | POA: Diagnosis not present

## 2023-08-10 MED ORDER — GABAPENTIN 300 MG PO CAPS: 300.0000 mg | ORAL_CAPSULE | Freq: Every day | ORAL | 6 refills | Status: AC

## 2023-08-10 NOTE — Progress Notes (Signed)
 Safety precautions to be maintained throughout the outpatient stay will include: orient to surroundings, keep bed in low position, maintain call bell within reach at all times, provide assistance with transfer out of bed and ambulation.

## 2023-08-10 NOTE — Progress Notes (Signed)
 PROVIDER NOTE: Information contained herein reflects review and annotations entered in association with encounter. Interpretation of such information and data should be left to medically-trained personnel. Information provided to patient can be located elsewhere in the medical record under Patient Instructions. Document created using STT-dictation technology, any transcriptional errors that may result from process are unintentional.    Patient: Erica Cline  Service Category: E/M  Provider: Wallie Sherry, MD  DOB: 11-21-1960  DOS: 08/10/2023  Referring Provider: Maribeth Camellia MATSU, MD  MRN: [id number]  Specialty: Interventional Pain Management  PCP: Erica Camellia MATSU, MD  Type: Established Patient  Setting: Ambulatory outpatient    Location: Office  Delivery: Face-to-face     HPI  Erica Cline, a 63 y.o. year old female, is here today because of her Chronic pain of right knee [M25.561, G89.29]. [name] primary complain today is Back Pain (Lumbar right ) and Knee Pain (Left, bone on bone, trying to avoid joint replacement )  Pertinent problems: Erica Cline has Lumbar radicular pain (right L4/5); Chronic pain of right knee; and Primary osteoarthritis of left knee on their pertinent problem list. Pain Assessment: Severity of Chronic pain is reported as a 1 /10. Location: Back (left knee) Lower, Right/occassional down right leg. Onset: More than a month ago. Quality: Constant, Discomfort, Other (Comment) (stiff and cathes after sleeping or immobility.). Timing: Constant. Modifying factor(s): gabapentin  is working well.  ibuprofen. Vitals:  height is 5' 6 (1.676 m) and weight is 211 lb (95.7 kg). Her temporal temperature is 97.2 F (36.2 C) (abnormal). Her blood pressure is 91/55 (abnormal) and her pulse is 80. Her respiration is 16 and oxygen saturation is 99%.  BMI: Estimated body mass index is 34.06 kg/m as calculated from the following:   Height as of this encounter: 5' 6 (1.676 m).    Weight as of this encounter: 211 lb (95.7 kg). Last encounter: Visit date not found. Last procedure: Visit date not found.  Reason for encounter: medication management.  Discussed the use of AI scribe software for clinical note transcription with the patient, who gave verbal consent to proceed.  History of Present Illness   The patient presented for a refill of gabapentin , which is being used to manage low back pain and occasional knee discomfort. The knee pain is described as creaky but not particularly painful, and is manageable as long as strenuous walking is avoided. The back pain is reported to be minimal, primarily presenting as morning stiffness that resolves upon movement. The patient has chosen to avoid lifting to prevent exacerbation of the back pain.  The patient has been taking gabapentin  300mg , one to two tablets at night, and reports satisfaction with this regimen, stating that she rarely experiences severe pain. However, the patient ran out of gabapentin  approximately a week prior to the consultation and has not been taking it since. The patient also mentioned taking over-the-counter supplements, including a product containing GABA, but was unsure of the relationship between this and her prescribed gabapentin .  The patient expressed a willingness to restart the gabapentin  at a dose of 300mg  and to increase the dose to 600mg  if necessary. The patient was aware of the need for consistent use of gabapentin  for optimal pain management.       ROS  Constitutional: Denies any fever or chills Gastrointestinal: No reported hemesis, hematochezia, vomiting, or acute GI distress Musculoskeletal: as above Neurological: No reported episodes of acute onset apraxia, aphasia, dysarthria, agnosia, amnesia, paralysis, loss of coordination, or  loss of consciousness  Medication Review  gabapentin   History Review  Allergy: [name] is allergic to latex. Drug: [name]  reports no history  of drug use. Alcohol:  reports current alcohol use. Tobacco:  reports that she has quit smoking. She has never used smokeless tobacco. Social: Erica Cline  reports that she has quit smoking. She has never used smokeless tobacco. She reports current alcohol use. She reports that she does not use drugs. Medical:  has a past medical history of Allergic, Anxiety, Arrhythmia, COVID, Dermatitis of eyelids of both eyes, Fatigue, GERD (gastroesophageal reflux disease), Hyperlipidemia, Low serum vitamin B12, Mobitz type 2 second degree heart block, and Sick sinus syndrome (HCC). Surgical: Erica Cline  has a past surgical history that includes Abdominal hysterectomy (2005); Cholecystectomy (2003); and Pacemaker insertion (10-07-2011). Family: family history includes Colon cancer in her maternal grandmother.  Laboratory Chemistry Profile   Renal Lab Results  Component Value Date   BUN 21 09/11/2022   CREATININE 0.66 09/11/2022   BCR SEE NOTE: 09/11/2022   GFR 90.35 04/20/2012   GFRAA >60 10/05/2011   GFRNONAA >60 02/08/2022    Hepatic Lab Results  Component Value Date   AST 15 02/24/2013   ALT 15 02/24/2013   ALBUMIN 4.3 02/24/2013   ALKPHOS 96 02/24/2013    Electrolytes Lab Results  Component Value Date   NA 142 09/11/2022   K 4.2 09/11/2022   CL 107 09/11/2022   CALCIUM 10.2 09/11/2022    Bone No results found for: VD25OH, VD125OH2TOT, CI6874NY7, CI7874NY7, 25OHVITD1, 25OHVITD2, 25OHVITD3, TESTOFREE, TESTOSTERONE  Inflammation (CRP: Acute Phase) (ESR: Chronic Phase) No results found for: CRP, ESRSEDRATE, LATICACIDVEN       Note: Above Lab results reviewed.  Recent Imaging Review  CUP PACEART REMOTE DEVICE CHECK Scheduled remote reviewed. Normal device function.    Next remote 91 days. - CS, CVRS Note: Reviewed        Physical Exam  General appearance: Well nourished, well developed, and well hydrated. In no apparent acute distress Mental status: Alert,  oriented x 3 (person, place, & time)       Respiratory: No evidence of acute respiratory distress Eyes: PERLA Vitals: BP (!) 91/55 (BP Location: Right Arm, Patient Position: Sitting, Cuff Size: Normal)   Pulse 80   Temp (!) 97.2 F (36.2 C) (Temporal)   Resp 16   Ht 5' 6 (1.676 m)   Wt 211 lb (95.7 kg)   SpO2 99%   BMI 34.06 kg/m  BMI: Estimated body mass index is 34.06 kg/m as calculated from the following:   Height as of this encounter: 5' 6 (1.676 m).   Weight as of this encounter: 211 lb (95.7 kg). Ideal: Ideal body weight: 59.3 kg (130 lb 11.7 oz) Adjusted ideal body weight: 73.9 kg (162 lb 13.4 oz)  Assessment   Diagnosis Status  1. Chronic pain of right knee   2. Lumbar radicular pain   3. Primary osteoarthritis of left knee    Controlled Controlled Controlled   Updated Problems: No problems updated.  Plan of Care  Problem-specific:  Assessment and Plan    Chronic Low Back Pain Chronic low back pain presents with occasional morning stiffness, improving with movement. No significant exacerbation is reported. Gabapentin  300 mg at night has been effective. Regular dosing of gabapentin  is advised, with PRN alternatives like acetaminophen  and NSAIDs discussed. Refill gabapentin  300 mg, 1-2 tablets at night. Advise regular dosing and instruct to call for a virtual refill if  needed before year's end.  Intermittent Knee Pain Intermittent knee pain is described as creaky and is managed with activity modification. Continue current management with activity modification.  General Health Maintenance Continue current lifestyle modifications.       Erica Cline has a current medication list which includes the following long-term medication(s): gabapentin .  Pharmacotherapy (Medications Ordered): Meds ordered this encounter  Medications   gabapentin  (NEURONTIN ) 300 MG capsule    Sig: Take 1-2 capsules (300-600 mg total) by mouth at bedtime.    Dispense:  90  capsule    Refill:  6   Orders:  No orders of the defined types were placed in this encounter.  Follow-up plan:   Return for patient will call to schedule F2F appt prn.      Right L4/5 ESI #1 07/14/21, #2 03/09/2022, left genicular nerve block 03/09/2022       Recent Visits No visits were found meeting these conditions. Showing recent visits within past 90 days and meeting all other requirements Today's Visits Date Type Provider Dept  08/10/23 Office Visit Marcelino Nurse, MD [facility]  Showing today's visits and meeting all other requirements Future Appointments No visits were found meeting these conditions. Showing future appointments within next 90 days and meeting all other requirements  I discussed the assessment and treatment plan with the patient. The patient was provided an opportunity to ask questions and all were answered. The patient agreed with the plan and demonstrated an understanding of the instructions.  Patient advised to call back or seek an in-person evaluation if the symptoms or condition worsens.  Duration of encounter: .  Total time on encounter, as per AMA guidelines included both the face-to-face and non-face-to-face time personally spent by the physician and/or other qualified health care professional(s) on the day of the encounter (includes time in activities that require the physician or other qualified health care professional and does not include time in activities normally performed by clinical staff). Physician's time may include the following activities when performed: Preparing to see the patient (e.g., pre-charting review of records, searching for previously ordered imaging, lab work, and nerve conduction tests) Review of prior analgesic pharmacotherapies. Reviewing PMP Interpreting ordered tests (e.g., lab work, imaging, nerve conduction tests) Performing post-procedure evaluations, including interpretation of diagnostic  procedures Obtaining and/or reviewing separately obtained history Performing a medically appropriate examination and/or evaluation Counseling and educating the patient/family/caregiver Ordering medications, tests, or procedures Referring and communicating with other health care professionals (when not separately reported) Documenting clinical information in the electronic or other health record Independently interpreting results (not separately reported) and communicating results to the patient/ family/caregiver Care coordination (not separately reported)  Note by: Nurse Marcelino, MD Date: 08/10/2023; Time: 10:18 AM

## 2023-08-12 ENCOUNTER — Ambulatory Visit (INDEPENDENT_AMBULATORY_CARE_PROVIDER_SITE_OTHER): Payer: BC Managed Care – PPO

## 2023-08-12 DIAGNOSIS — I495 Sick sinus syndrome: Secondary | ICD-10-CM | POA: Diagnosis not present

## 2023-08-18 LAB — CUP PACEART REMOTE DEVICE CHECK
Battery Impedance: 5026 Ohm
Battery Remaining Longevity: 6 mo
Battery Voltage: 2.68 V
Brady Statistic AP VP Percent: 0 %
Brady Statistic AP VS Percent: 15 %
Brady Statistic AS VP Percent: 0 %
Brady Statistic AS VS Percent: 85 %
Date Time Interrogation Session: 20250121123052
Implantable Lead Connection Status: 753985
Implantable Lead Connection Status: 753985
Implantable Lead Implant Date: 20130313
Implantable Lead Implant Date: 20130313
Implantable Lead Location: 753859
Implantable Lead Location: 753860
Implantable Lead Model: 5092
Implantable Lead Model: 5594
Implantable Pulse Generator Implant Date: 20130313
Lead Channel Impedance Value: 437 Ohm
Lead Channel Impedance Value: 495 Ohm
Lead Channel Pacing Threshold Amplitude: 0.375 V
Lead Channel Pacing Threshold Amplitude: 1.375 V
Lead Channel Pacing Threshold Pulse Width: 0.4 ms
Lead Channel Pacing Threshold Pulse Width: 0.4 ms
Lead Channel Setting Pacing Amplitude: 1.5 V
Lead Channel Setting Pacing Amplitude: 3 V
Lead Channel Setting Pacing Pulse Width: 0.4 ms
Lead Channel Setting Sensing Sensitivity: 5.6 mV
Zone Setting Status: 755011
Zone Setting Status: 755011

## 2023-09-19 DIAGNOSIS — L03213 Periorbital cellulitis: Secondary | ICD-10-CM | POA: Diagnosis not present

## 2023-09-20 NOTE — Progress Notes (Signed)
 Remote pacemaker transmission.

## 2023-09-22 ENCOUNTER — Ambulatory Visit: Payer: BC Managed Care – PPO

## 2023-09-23 ENCOUNTER — Encounter: Payer: Self-pay | Admitting: Cardiology

## 2023-09-26 LAB — CUP PACEART REMOTE DEVICE CHECK
Battery Impedance: 5275 Ohm
Battery Remaining Longevity: 4 mo
Battery Voltage: 2.67 V
Brady Statistic AP VP Percent: 0 %
Brady Statistic AP VS Percent: 15 %
Brady Statistic AS VP Percent: 0 %
Brady Statistic AS VS Percent: 85 %
Date Time Interrogation Session: 20250302171104
Implantable Lead Connection Status: 753985
Implantable Lead Connection Status: 753985
Implantable Lead Implant Date: 20130313
Implantable Lead Implant Date: 20130313
Implantable Lead Location: 753859
Implantable Lead Location: 753860
Implantable Lead Model: 5092
Implantable Lead Model: 5594
Implantable Pulse Generator Implant Date: 20130313
Lead Channel Impedance Value: 417 Ohm
Lead Channel Impedance Value: 441 Ohm
Lead Channel Pacing Threshold Amplitude: 0.375 V
Lead Channel Pacing Threshold Amplitude: 1.375 V
Lead Channel Pacing Threshold Pulse Width: 0.4 ms
Lead Channel Pacing Threshold Pulse Width: 0.4 ms
Lead Channel Setting Pacing Amplitude: 1.5 V
Lead Channel Setting Pacing Amplitude: 2.75 V
Lead Channel Setting Pacing Pulse Width: 0.4 ms
Lead Channel Setting Sensing Sensitivity: 4 mV
Zone Setting Status: 755011
Zone Setting Status: 755011

## 2023-09-28 ENCOUNTER — Encounter: Payer: Self-pay | Admitting: Cardiology

## 2023-11-11 ENCOUNTER — Ambulatory Visit: Payer: BC Managed Care – PPO

## 2023-12-27 ENCOUNTER — Ambulatory Visit (INDEPENDENT_AMBULATORY_CARE_PROVIDER_SITE_OTHER): Payer: BC Managed Care – PPO

## 2023-12-27 DIAGNOSIS — I495 Sick sinus syndrome: Secondary | ICD-10-CM

## 2023-12-29 ENCOUNTER — Telehealth: Payer: Self-pay

## 2023-12-29 NOTE — Telephone Encounter (Signed)
 Spoke w/ patient - she is scheduled to see Dr. Marven Slimmer on 6/11 in [place]. She advised she will send the transmission in as soon as she gets off work.

## 2023-12-29 NOTE — Telephone Encounter (Signed)
 Patient's remote device is not transmitting and she is also past due for follow up.  Her device was nearing ERI (1-6 months in March) battery life could be ending.   Please get patient set up in the next few weeks either in [place] or [employer].  App is fine.  Thanks.

## 2023-12-30 LAB — CUP PACEART REMOTE DEVICE CHECK
Battery Impedance: 6842 Ohm
Battery Remaining Longevity: 1 mo — CL
Battery Voltage: 2.62 V
Brady Statistic AP VP Percent: 0 %
Brady Statistic AP VS Percent: 14 %
Brady Statistic AS VP Percent: 0 %
Brady Statistic AS VS Percent: 86 %
Date Time Interrogation Session: 20250604195541
Implantable Lead Connection Status: 753985
Implantable Lead Connection Status: 753985
Implantable Lead Implant Date: 20130313
Implantable Lead Implant Date: 20130313
Implantable Lead Location: 753859
Implantable Lead Location: 753860
Implantable Lead Model: 5092
Implantable Lead Model: 5594
Implantable Pulse Generator Implant Date: 20130313
Lead Channel Impedance Value: 454 Ohm
Lead Channel Impedance Value: 456 Ohm
Lead Channel Pacing Threshold Amplitude: 0.375 V
Lead Channel Pacing Threshold Amplitude: 1.375 V
Lead Channel Pacing Threshold Pulse Width: 0.4 ms
Lead Channel Pacing Threshold Pulse Width: 0.4 ms
Lead Channel Setting Pacing Amplitude: 1.5 V
Lead Channel Setting Pacing Amplitude: 2.75 V
Lead Channel Setting Pacing Pulse Width: 0.4 ms
Lead Channel Setting Sensing Sensitivity: 5.6 mV
Zone Setting Status: 755011
Zone Setting Status: 755011

## 2024-01-04 NOTE — Progress Notes (Unsigned)
  Electrophysiology Office Follow up Visit Note:    Date:  01/05/2024   ID:  Erica Cline, DOB Nov 28, 1960, MRN [id number]  PCP:  Pcp, No  [facility] Cardiologist:  None  [facility] Electrophysiologist:  Boyce Byes, MD    Interval History:     Erica Cline is a 63 y.o. female who presents for a follow up visit.   The patient was last seen December 30, 2018 for.  She has a history of Mobitz 2, sick sinus syndrome with a permanent pacemaker implanted in 2013.  Her generator is approaching ERI.  She presents for routine follow-up.  She is with her husband today in clinic.  She has been feeling well.        Past medical, surgical, social and family history were reviewed.  ROS:   Please see the history of present illness.    All other systems reviewed and are negative.  EKGs/Labs/Other Studies Reviewed:    The following studies were reviewed today:  January 05, 2024 in-clinic device interrogation personally reviewed Battery approaching ERI, estimated less than 1 month Lead parameter stable No ventricular pacing Atrial pacing less than 20% No programming changes made today        Physical Exam:    VS:  BP 112/80 (BP Location: Right Arm)   Pulse 79   Ht 5' 6 (1.676 m)   Wt 218 lb (98.9 kg)   SpO2 99%   BMI 35.19 kg/m     Wt Readings from Last 3 Encounters:  01/05/24 218 lb (98.9 kg)  08/10/23 211 lb (95.7 kg)  12/30/22 216 lb (98 kg)     GEN: no distress CARD: RRR, No MRG.  CIED pocket well-healed RESP: No IWOB. CTAB.      ASSESSMENT:    1. SSS (sick sinus syndrome) (HCC)   2. Mobitz type 2 second degree heart block   3. Pacemaker    PLAN:    In order of problems listed above:  #Sick sinus syndrome #Mobitz 2 AV block #Permanent pacemaker in situ Device functioning appropriately.  Continue remote monitoring.  Her device is reaching ERI.  I discussed the generator replacement procedure in detail during today's clinic appointment.  When  her device reaches ERI we will schedule her for generator replacement without need for another office visit.  We will set up a remote check every 2 weeks until she reaches ERI.     Signed, Harvie Liner, MD, [facility], [facility] 01/05/2024 9:40 AM    Electrophysiology [facility]

## 2024-01-05 ENCOUNTER — Ambulatory Visit: Attending: Cardiology | Admitting: Cardiology

## 2024-01-05 VITALS — BP 112/80 | HR 79 | Ht 66.0 in | Wt 218.0 lb

## 2024-01-05 DIAGNOSIS — I441 Atrioventricular block, second degree: Secondary | ICD-10-CM | POA: Diagnosis not present

## 2024-01-05 DIAGNOSIS — I495 Sick sinus syndrome: Secondary | ICD-10-CM | POA: Diagnosis not present

## 2024-01-05 DIAGNOSIS — Z950 Presence of cardiac pacemaker: Secondary | ICD-10-CM | POA: Diagnosis not present

## 2024-01-05 LAB — CUP PACEART INCLINIC DEVICE CHECK
Date Time Interrogation Session: 20250611110703
Implantable Lead Connection Status: 753985
Implantable Lead Connection Status: 753985
Implantable Lead Implant Date: 20130313
Implantable Lead Implant Date: 20130313
Implantable Lead Location: 753859
Implantable Lead Location: 753860
Implantable Lead Model: 5092
Implantable Lead Model: 5594
Implantable Pulse Generator Implant Date: 20130313

## 2024-01-05 NOTE — Patient Instructions (Signed)
 Medication Instructions:  Your physician recommends that you continue on your current medications as directed. Please refer to the Current Medication list given to you today.  *If you need a refill on your cardiac medications before your next appointment, please call your pharmacy*  Follow-Up: At [facility], you and your health needs are our priority.  As part of our continuing mission to provide you with exceptional heart care, our providers are all part of one team.  This team includes your primary Cardiologist (physician) and Advanced Practice Providers or APPs (Physician Assistants and Nurse Practitioners) who all work together to provide you with the care you need, when you need it.  Your next appointment:   6 months  Provider:   You may see Lanier Prude, MD or one of the following Advanced Practice Providers on your designated Care Team:   Francis Dowse, [place] [address] [place], [place] Sherie Don, NP Canary Brim, NP

## 2024-01-07 ENCOUNTER — Ambulatory Visit: Payer: Self-pay | Admitting: Cardiology

## 2024-01-14 ENCOUNTER — Encounter: Payer: Self-pay | Admitting: Cardiology

## 2024-01-14 NOTE — Telephone Encounter (Signed)
 Called and spoke with the patient.  Asked the patient about the symptoms she described. The patient states the symptoms she feels are similar to when she puts the magnet on her pacemaker.  It momentarily takes her breath away, and her heart feels like its pounding.  When she takes her heart rate, it is normal and she doesn't think it is in arhythmia. Patient reports no other symptoms like shortness of breath, dizziness, or chest pain.  Patient did state that during her last visit, Dr. Marven Slimmer mention that she may be coming up on battery change.  Patient advised to go the Emergency room for worsening symptoms or any chest pain, shortness of breath or dizziness and to give us  a call back if these episodes increase. Patient verbalized understanding with no questions or concerns expressed at this time.

## 2024-01-14 NOTE — Telephone Encounter (Signed)
 Outreach made to Pt.  Requested Pt send a transmission.  She is not home right now, but she will send a transmission when she is.  Advised Pt if it is after 5 pm, this nurse will follow up with Pt first thing on Monday morning.  Pt thanked nurse for call back.

## 2024-01-17 ENCOUNTER — Telehealth: Payer: Self-pay

## 2024-01-17 DIAGNOSIS — I441 Atrioventricular block, second degree: Secondary | ICD-10-CM

## 2024-01-17 DIAGNOSIS — I495 Sick sinus syndrome: Secondary | ICD-10-CM

## 2024-01-17 DIAGNOSIS — Z950 Presence of cardiac pacemaker: Secondary | ICD-10-CM

## 2024-01-17 NOTE — Telephone Encounter (Signed)
 Transmission received from Pt.  Pt reached ERI on her Adapta pacemaker 6/12/52025.  Will send to scheduler to schedule gen change.

## 2024-01-19 ENCOUNTER — Other Ambulatory Visit: Payer: Self-pay

## 2024-01-19 ENCOUNTER — Encounter

## 2024-01-19 DIAGNOSIS — Z950 Presence of cardiac pacemaker: Secondary | ICD-10-CM

## 2024-01-19 DIAGNOSIS — I495 Sick sinus syndrome: Secondary | ICD-10-CM

## 2024-01-19 DIAGNOSIS — I441 Atrioventricular block, second degree: Secondary | ICD-10-CM

## 2024-01-19 NOTE — Telephone Encounter (Signed)
 See MyChart message. Patient has been scheduled for August 5th.

## 2024-02-03 DIAGNOSIS — L603 Nail dystrophy: Secondary | ICD-10-CM | POA: Diagnosis not present

## 2024-02-10 ENCOUNTER — Ambulatory Visit: Payer: BC Managed Care – PPO

## 2024-02-15 NOTE — Progress Notes (Signed)
 Remote pacemaker transmission.

## 2024-02-15 NOTE — Addendum Note (Signed)
 Addended by: TAWNI DRILLING D on: 02/15/2024 02:23 PM   Modules accepted: Orders, Level of Service

## 2024-02-18 DIAGNOSIS — Z950 Presence of cardiac pacemaker: Secondary | ICD-10-CM | POA: Diagnosis not present

## 2024-02-18 DIAGNOSIS — I441 Atrioventricular block, second degree: Secondary | ICD-10-CM | POA: Diagnosis not present

## 2024-02-18 DIAGNOSIS — I495 Sick sinus syndrome: Secondary | ICD-10-CM | POA: Diagnosis not present

## 2024-02-19 LAB — BASIC METABOLIC PANEL WITH GFR
BUN/Creatinine Ratio: 24 (ref 12–28)
BUN: 17 mg/dL (ref 8–27)
CO2: 23 mmol/L (ref 20–29)
Calcium: 10.6 mg/dL — ABNORMAL HIGH (ref 8.7–10.3)
Chloride: 107 mmol/L — ABNORMAL HIGH (ref 96–106)
Creatinine, Ser: 0.72 mg/dL (ref 0.57–1.00)
Glucose: 104 mg/dL — ABNORMAL HIGH (ref 70–99)
Potassium: 3.8 mmol/L (ref 3.5–5.2)
Sodium: 143 mmol/L (ref 134–144)
eGFR: 94 mL/min/1.73 (ref >59)

## 2024-02-19 LAB — CBC WITH DIFFERENTIAL/PLATELET
Basophils Absolute: 0 x10E3/uL (ref 0.0–0.2)
Basos: 1 %
EOS (ABSOLUTE): 0.1 x10E3/uL (ref 0.0–0.4)
Eos: 1 %
Hematocrit: 41.7 % (ref 34.0–46.6)
Hemoglobin: 13.3 g/dL (ref 11.1–15.9)
Immature Grans (Abs): 0 x10E3/uL (ref 0.0–0.1)
Immature Granulocytes: 0 %
Lymphocytes Absolute: 1.9 x10E3/uL (ref 0.7–3.1)
Lymphs: 27 %
MCH: 28.9 pg (ref 26.6–33.0)
MCHC: 31.9 g/dL (ref 31.5–35.7)
MCV: 91 fL (ref 79–97)
Monocytes Absolute: 0.6 x10E3/uL (ref 0.1–0.9)
Monocytes: 9 %
Neutrophils Absolute: 4.4 x10E3/uL (ref 1.4–7.0)
Neutrophils: 62 %
Platelets: 302 x10E3/uL (ref 150–450)
RBC: 4.6 x10E6/uL (ref 3.77–5.28)
RDW: 11.8 % (ref 11.7–15.4)
WBC: 7 x10E3/uL (ref 3.4–10.8)

## 2024-02-22 ENCOUNTER — Telehealth (HOSPITAL_COMMUNITY): Payer: Self-pay

## 2024-02-22 NOTE — Telephone Encounter (Signed)
 Spoke with patient to discuss upcoming procedure.   Confirmed patient is scheduled for a PPM generator change on Tuesday, August 5 with Dr. Ole Holts. Instructed patient to arrive at the Main Entrance A at [facility]: [address] [place], [place] [address] and check in at Admitting at 12:30 PM.   Labs completed  Any recent signs of acute illness or been started on antibiotics? No Any new medications started? No Any medications to hold? No Medication instructions:  On the morning of your procedure you may take morning medications with a sip of water. No eating or drinking after midnight prior to procedure.   The night before your procedure and the morning of your procedure, wash thoroughly with the CHG surgical soap from the neck down, paying special attention to the area where your procedure will be performed.  Advised of plan to go home the same day and will only stay overnight if medically necessary. You MUST have a responsible adult to drive you home and MUST be with you the first 24 hours after you arrive home.  Patient verbalized understanding to all instructions provided and agreed to proceed with procedure.

## 2024-02-25 ENCOUNTER — Encounter: Payer: Self-pay | Admitting: Emergency Medicine

## 2024-02-28 NOTE — Pre-Procedure Instructions (Signed)
Instructed patient on the following items: Arrival time 1200 Nothing to eat or drink after midnight No meds AM of procedure Responsible person to drive you home and stay with you for 24 hrs

## 2024-02-29 ENCOUNTER — Other Ambulatory Visit: Payer: Self-pay

## 2024-02-29 ENCOUNTER — Ambulatory Visit (HOSPITAL_COMMUNITY)
Admission: RE | Disposition: A | Discharge status: Home / Self Care | Payer: Self-pay | Source: Home / Self Care | Attending: Cardiology

## 2024-02-29 ENCOUNTER — Ambulatory Visit (HOSPITAL_COMMUNITY)
Admission: RE | Admit: 2024-02-29 | Discharge: 2024-02-29 | Disposition: A | Discharge status: Home / Self Care | Attending: Cardiology | Admitting: Cardiology

## 2024-02-29 DIAGNOSIS — Z45010 Encounter for checking and testing of cardiac pacemaker pulse generator [battery]: Secondary | ICD-10-CM | POA: Diagnosis not present

## 2024-02-29 DIAGNOSIS — I441 Atrioventricular block, second degree: Secondary | ICD-10-CM | POA: Diagnosis not present

## 2024-02-29 DIAGNOSIS — I495 Sick sinus syndrome: Secondary | ICD-10-CM | POA: Insufficient documentation

## 2024-02-29 HISTORY — PX: PPM GENERATOR CHANGEOUT: EP1233

## 2024-02-29 SURGERY — PPM GENERATOR CHANGEOUT

## 2024-02-29 MED ORDER — SODIUM CHLORIDE 0.9 % IV SOLN
80.0000 mg | INTRAVENOUS | Status: AC
  Administered 2024-02-29: 80 mg

## 2024-02-29 MED ORDER — ONDANSETRON HCL 4 MG/2ML IJ SOLN: 4.0000 mg | Freq: Four times a day (QID) | INTRAMUSCULAR | Status: DC | PRN

## 2024-02-29 MED ORDER — GENTAMICIN SULFATE 40 MG/ML IJ SOLN
INTRAMUSCULAR | Status: AC
  Filled 2024-02-29: qty 2

## 2024-02-29 MED ORDER — CEFAZOLIN SODIUM-DEXTROSE 2-4 GM/100ML-% IV SOLN
2.0000 g | INTRAVENOUS | Status: AC
  Administered 2024-02-29: 2 g via INTRAVENOUS

## 2024-02-29 MED ORDER — MIDAZOLAM HCL 2 MG/2ML IJ SOLN
INTRAMUSCULAR | Status: AC
  Filled 2024-02-29: qty 2

## 2024-02-29 MED ORDER — LIDOCAINE HCL (PF) 1 % IJ SOLN
INTRAMUSCULAR | Status: DC | PRN
  Administered 2024-02-29: 50 mL

## 2024-02-29 MED ORDER — LIDOCAINE HCL (PF) 1 % IJ SOLN
INTRAMUSCULAR | Status: AC
  Filled 2024-02-29: qty 60

## 2024-02-29 MED ORDER — SODIUM CHLORIDE 0.9 % IV SOLN: INTRAVENOUS | Status: DC

## 2024-02-29 MED ORDER — FENTANYL CITRATE (PF) 100 MCG/2ML IJ SOLN
INTRAMUSCULAR | Status: AC
Start: 2024-02-29 — End: 2024-02-29
  Filled 2024-02-29: qty 2

## 2024-02-29 MED ORDER — ACETAMINOPHEN 325 MG PO TABS: 325.0000 mg | ORAL_TABLET | ORAL | Status: DC | PRN

## 2024-02-29 MED ORDER — CEFAZOLIN SODIUM-DEXTROSE 2-4 GM/100ML-% IV SOLN
INTRAVENOUS | Status: DC
Start: 2024-02-29 — End: 2024-02-29
  Filled 2024-02-29: qty 100

## 2024-02-29 MED ORDER — FENTANYL CITRATE (PF) 100 MCG/2ML IJ SOLN
INTRAMUSCULAR | Status: DC | PRN
  Administered 2024-02-29: 25 ug via INTRAVENOUS

## 2024-02-29 MED ORDER — POVIDONE-IODINE 10 % EX SWAB
2.0000 | Freq: Once | CUTANEOUS | Status: AC
  Administered 2024-02-29: 2 via TOPICAL

## 2024-02-29 MED ORDER — MIDAZOLAM HCL 5 MG/5ML IJ SOLN
INTRAMUSCULAR | Status: DC | PRN
  Administered 2024-02-29: 1 mg via INTRAVENOUS

## 2024-02-29 MED ORDER — CHLORHEXIDINE GLUCONATE 4 % EX SOLN: 4.0000 | Freq: Once | CUTANEOUS | Status: DC

## 2024-02-29 SURGICAL SUPPLY — 5 items
CABLE SURGICAL S-101-97-12 (CABLE) ×1 IMPLANT
IPG PACE AZUR XT DR MRI W1DR01 (Pacemaker) IMPLANT
PAD DEFIB RADIO PHYSIO CONN (PAD) ×1 IMPLANT
POUCH AIGIS-R ANTIBACT PPM MED (Mesh General) IMPLANT
TRAY PACEMAKER INSERTION (PACKS) ×1 IMPLANT

## 2024-02-29 NOTE — H&P (Signed)
  Electrophysiology Office Follow up Visit Note:     Date:  02/29/2024    ID:  Erica Cline, DOB January 12, 1961, MRN [id number]   PCP:  Pcp, No  [facility] Cardiologist:  None  [facility] Electrophysiologist:  OLE ONEIDA HOLTS, MD      Interval History:       Erica Cline is a 63 y.o. female who presents for a follow up visit.    The patient was last seen December 30, 2018 for.  She has a history of Mobitz 2, sick sinus syndrome with a permanent pacemaker implanted in 2013.  Her generator is approaching ERI.  She presents for routine follow-up.   She is with her husband today in clinic.  She has been feeling well.   Presents for generator replacement. Procedure reviewed.   Objective Past medical, surgical, social and family history were reviewed.   ROS:   Please see the history of present illness.    All other systems reviewed and are negative.   EKGs/Labs/Other Studies Reviewed:     The following studies were reviewed today:   January 05, 2024 in-clinic device interrogation personally reviewed Battery approaching ERI, estimated less than 1 month Lead parameter stable No ventricular pacing Atrial pacing less than 20% No programming changes made today           Physical Exam:     VS:  BP 112/80 (BP Location: Right Arm)   Pulse 79   Ht 5' 6 (1.676 m)   Wt 218 lb (98.9 kg)   SpO2 99%   BMI 35.19 kg/m         Wt Readings from Last 3 Encounters:  01/05/24 218 lb (98.9 kg)  08/10/23 211 lb (95.7 kg)  12/30/22 216 lb (98 kg)      GEN: no distress CARD: RRR, No MRG.  CIED pocket well-healed RESP: No IWOB. CTAB.     Assessment ASSESSMENT:     1. SSS (sick sinus syndrome) (HCC)   2. Mobitz type 2 second degree heart block   3. Pacemaker     PLAN:     In order of problems listed above:   #Sick sinus syndrome #Mobitz 2 AV block #Permanent pacemaker in situ Device functioning appropriately.  Continue remote monitoring.  Her device is reaching ERI.  I  discussed the generator replacement procedure in detail during today's clinic appointment.  When her device reaches ERI we will schedule her for generator replacement without need for another office visit.   We will set up a remote check every 2 weeks until she reaches ERI.    Presents for generator replacement. Procedure reviewed.  Risks, benefits, and alternatives to pulse generator replacement were discussed in detail today.  The patient understands that risks include but are not limited to bleeding, infection, pneumothorax, perforation, tamponade, vascular damage, renal failure, MI, stroke, death, inappropriate shocks, damage to his existing leads, and lead dislodgement and wishes to proceed.  We will therefore schedule the procedure at the next available time.      Signed, OLE HOLTS, MD, [facility] 02/29/2024 Electrophysiology [facility]

## 2024-02-29 NOTE — Discharge Instructions (Addendum)

## 2024-03-01 ENCOUNTER — Encounter (HOSPITAL_COMMUNITY): Payer: Self-pay | Admitting: Cardiology

## 2024-03-02 MED FILL — Midazolam HCl Inj 2 MG/2ML (Base Equivalent): INTRAMUSCULAR | Qty: 1 | Status: AC

## 2024-03-03 ENCOUNTER — Encounter: Payer: Self-pay | Admitting: Cardiology

## 2024-03-14 ENCOUNTER — Ambulatory Visit: Attending: Cardiology

## 2024-03-14 DIAGNOSIS — I441 Atrioventricular block, second degree: Secondary | ICD-10-CM

## 2024-03-14 LAB — CUP PACEART INCLINIC DEVICE CHECK
Date Time Interrogation Session: 20250819120927
Implantable Lead Connection Status: 753985
Implantable Lead Connection Status: 753985
Implantable Lead Implant Date: 20130313
Implantable Lead Implant Date: 20130313
Implantable Lead Location: 753859
Implantable Lead Location: 753860
Implantable Lead Model: 5092
Implantable Lead Model: 5594
Implantable Pulse Generator Implant Date: 20250805

## 2024-03-14 NOTE — Patient Instructions (Addendum)

## 2024-03-14 NOTE — Progress Notes (Signed)
 Normal dual chamber pacemaker wound check. Presenting rhythm: AS/VS 80. Wound well healed. Routine testing performed. Thresholds, sensing, and impedances consistent with implant measurements. No episodes. Pt enrolled in remote follow-up.

## 2024-03-15 ENCOUNTER — Encounter: Payer: Self-pay | Admitting: Cardiology

## 2024-03-16 ENCOUNTER — Ambulatory Visit: Payer: Self-pay | Admitting: Cardiology

## 2024-03-30 DIAGNOSIS — L603 Nail dystrophy: Secondary | ICD-10-CM | POA: Diagnosis not present

## 2024-03-30 DIAGNOSIS — L570 Actinic keratosis: Secondary | ICD-10-CM | POA: Diagnosis not present

## 2024-03-30 DIAGNOSIS — D229 Melanocytic nevi, unspecified: Secondary | ICD-10-CM | POA: Diagnosis not present

## 2024-03-30 DIAGNOSIS — L814 Other melanin hyperpigmentation: Secondary | ICD-10-CM | POA: Diagnosis not present

## 2024-04-09 NOTE — Progress Notes (Signed)
  Electrophysiology Office Note:   ID:  Erica Cline, DOB April 26, 1961, MRN [id number]  Primary Cardiologist: None Electrophysiologist: OLE ONEIDA HOLTS, MD      History of Present Illness:   Erica Cline is a 63 y.o. female with h/o 2nd degree AV block and SSS s/p PPM seen today for routine electrophysiology followup.   Underwent gen change 8/5. After Wound check 8/19 complained of pain when reaching upward with her arm.   Since last being seen in our clinic the patient reports doing very well. Her discomfort has gradually, but completely resolved. Otherwise,  she denies chest pain, palpitations, dyspnea, PND, orthopnea, nausea, vomiting, dizziness, syncope, edema, weight gain, or early satiety.  She feels like she is coming down with a UTI and is to see her PCP.   Review of systems complete and found to be negative unless listed in HPI.   EP Information / Studies Reviewed:    EKG is ordered today. Personal review as below.  EKG Interpretation Date/Time:  Monday April 10 2024 08:12:15 EDT Ventricular Rate:  73 PR Interval:  180 QRS Duration:  82 QT Interval:  380 QTC Calculation: 418 R Axis:   25  Text Interpretation: Normal sinus rhythm Normal ECG When compared with ECG of 08-Feb-2022 11:24, No significant change was found Confirmed by Lesia Sharper [phone]) on 04/10/2024 8:19:21 AM    PPM Interrogation-  Most recent in person check reviewed in detail today.   Arrhythmia/Device History Medtronic Dual Chamber PPM 02/29/2024 for Second Degree AV block / SSS   Physical Exam:   VS:  BP 110/66   Pulse 73   Ht 5' 6 (1.676 m)   Wt 220 lb (99.8 kg)   SpO2 97%   BMI 35.51 kg/m    Wt Readings from Last 3 Encounters:  04/10/24 220 lb (99.8 kg)  02/29/24 220 lb (99.8 kg)  01/05/24 218 lb (98.9 kg)     GEN: No acute distress  NECK: No JVD; No carotid bruits CARDIAC: Regular rate and rhythm, no murmurs, rubs, gallops RESPIRATORY:  Clear to auscultation without rales,  wheezing or rhonchi  ABDOMEN: Soft, non-tender, non-distended EXTREMITIES:  No edema; No deformity   ASSESSMENT AND PLAN:    Sick sinus syndrome s/p Medtronic PPM  Stable in clinic check 8/19.   Site pain s/p gen change has resolved    Disposition:   Follow up with Dr. HOLTS as scheduled for 3 month post op visit.  Can likely increase follow up to 24 months at that point with steady remotes.   Signed, Sharper Prentice Lesia, PA-C

## 2024-04-10 ENCOUNTER — Ambulatory Visit: Attending: Student | Admitting: Student

## 2024-04-10 ENCOUNTER — Encounter: Payer: Self-pay | Admitting: Student

## 2024-04-10 VITALS — BP 110/66 | HR 73 | Ht 66.0 in | Wt 220.0 lb

## 2024-04-10 DIAGNOSIS — Z950 Presence of cardiac pacemaker: Secondary | ICD-10-CM

## 2024-04-10 DIAGNOSIS — I495 Sick sinus syndrome: Secondary | ICD-10-CM | POA: Diagnosis not present

## 2024-04-10 NOTE — Patient Instructions (Signed)
 Medication Instructions:  Your physician recommends that you continue on your current medications as directed. Please refer to the Current Medication list given to you today.  *If you need a refill on your cardiac medications before your next appointment, please call your pharmacy*  Lab Work: None ordered If you have labs (blood work) drawn today and your tests are completely normal, you will receive your results only by: MyChart Message (if you have MyChart) OR A paper copy in the mail If you have any lab test that is abnormal or we need to change your treatment, we will call you to review the results.  Follow-Up: At [facility], you and your health needs are our priority.  As part of our continuing mission to provide you with exceptional heart care, our providers are all part of one team.  This team includes your primary Cardiologist (physician) and Advanced Practice Providers or APPs (Physician Assistants and Nurse Practitioners) who all work together to provide you with the care you need, when you need it.  Your next appointment:   As scheduled  Provider:   Ole Holts, MD

## 2024-04-12 ENCOUNTER — Encounter: Payer: Self-pay | Admitting: Family Medicine

## 2024-04-12 ENCOUNTER — Ambulatory Visit (INDEPENDENT_AMBULATORY_CARE_PROVIDER_SITE_OTHER): Admitting: Family Medicine

## 2024-04-12 VITALS — BP 112/72 | HR 73 | Temp 97.9°F | Resp 18 | Ht 66.0 in | Wt 223.0 lb

## 2024-04-12 DIAGNOSIS — R300 Dysuria: Secondary | ICD-10-CM | POA: Diagnosis not present

## 2024-04-12 DIAGNOSIS — Z23 Encounter for immunization: Secondary | ICD-10-CM | POA: Diagnosis not present

## 2024-04-12 LAB — POCT URINALYSIS DIP (CLINITEK)
Bilirubin, UA: NEGATIVE
Blood, UA: NEGATIVE
Glucose, UA: NEGATIVE mg/dL
Ketones, POC UA: NEGATIVE mg/dL
Leukocytes, UA: NEGATIVE
Nitrite, UA: NEGATIVE
POC PROTEIN,UA: NEGATIVE
Spec Grav, UA: >=1.03 — AB (ref 1.010–1.025)
Urobilinogen, UA: 0.2 U/dL
pH, UA: 6 (ref 5.0–8.0)

## 2024-04-12 NOTE — Assessment & Plan Note (Signed)
 Urinalysis was normal except specific gravity was 1.030.  Advised that she does not have a bladder infection but she probably has irritation from the concentrated urine.  Encouraged her to push fluids.

## 2024-04-12 NOTE — Progress Notes (Signed)
 New Patient Office Visit  Subjective    Patient ID: Erica Cline, female    DOB: March 22, 1961  Age: 63 y.o. MRN: [id number]  CC:  Chief Complaint  Patient presents with   Establish Care   Urinary Tract Infection    HPI Erica Cline presents to establish care Discussed the use of AI scribe software for clinical note transcription with the patient, who gave verbal consent to proceed.  History of Present Illness   Erica Cline is a 63 year old female who presents with concerns about a possible urinary tract infection and recent gastrointestinal symptoms.  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 A month ago, she underwent a generator exchange and reports that the pacemaker is functioning well. The pacemaker does not have an upper limit.  She has osteoarthritis in her left knee and has been considering surgical intervention. After seeking a second opinion, she decided to focus on non-surgical management, including weight loss and exercise. She reports significant improvement in her knee pain after dietary changes, including reducing sugar and meat intake, and increasing whole foods. She uses ibuprofen 600 mg every six hours for two to three days to manage pain flare-ups, ensuring she eats with it to avoid gastrointestinal issues. She has tried topical treatments like Salonpas with limited success.  Recently, she experienced severe gastrointestinal symptoms, including diarrhea with mucous and green coloration, which she attributes to possible food poisoning. This episode left her dehydrated, and she is now attempting to increase her water intake, currently at 16 ounces per day, which she acknowledges is insufficient. She is concerned that she may have a bladder infection.  She has dysuria.  No fever.    She reports occasional low moods, which she attributes to life stressors and aging. She engages in regular exercise, including walking  and using a Peloton treadmill, and practices yoga and other stress-reducing activities. She notes feeling close to tears, which is unusual for her.  She and her husband built a house then decided that they would rather stay in their townhouse so they are selling the house.    She has no known B12 deficiency but is open to further evaluation as her chart suggests a potential issue. She is not currently taking any B12 supplements.     She sees a dermatologist annual for a skin check.     Outpatient Encounter Medications as of 04/12/2024  Medication Sig   gabapentin  (NEURONTIN ) 300 MG capsule Take 1-2 capsules (300-600 mg total) by mouth at bedtime.   No facility-administered encounter medications on file as of 04/12/2024.    Past Medical History:  Diagnosis Date   Allergic    Anxiety    Arrhythmia    COVID    Dermatitis of eyelids of both eyes    Fatigue    GERD (gastroesophageal reflux disease)    Hyperlipidemia    Low serum vitamin B12    Mobitz type 2 second degree heart block    Sees Dr. Ammon, pacer placed in 2013.   Sick sinus syndrome [facility])     Past Surgical History:  Procedure Laterality Date   ABDOMINAL HYSTERECTOMY  2005   CHOLECYSTECTOMY  2003   PACEMAKER INSERTION  10-07-2011   PPM GENERATOR CHANGEOUT N/A 02/29/2024   Procedure: PPM GENERATOR CHANGEOUT;  Surgeon: Cindie Ole DASEN, MD;  Location: [facility] INVASIVE CV LAB;  Service: Cardiovascular;  Laterality: N/A;    Family  History  Problem Relation Age of Onset   Colon cancer Maternal Grandmother     Social History   Socioeconomic History   Marital status: Married    Spouse name: Not on file   Number of children: Not on file   Years of education: Not on file   Highest education level: Some college, no degree  Occupational History   Not on file  Tobacco Use   Smoking status: Former    Passive exposure: Past   Smokeless tobacco: Never  Substance and Sexual Activity   Alcohol use: Yes    Comment: 1 glass  of wine 2 times monthly   Drug use: No   Sexual activity: Not on file  Other Topics Concern   Not on file  Social History Narrative   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   3 children, married.   Mother is Larry Salmon.   Social Drivers of Corporate investment banker Strain: Low Risk  (04/11/2024)   Overall Financial Resource Strain (CARDIA)    Difficulty of Paying Living Expenses: Not hard at all  Food Insecurity: No Food Insecurity (04/11/2024)   Hunger Vital Sign    Worried About Running Out of Food in the Last Year: Never true    Ran Out of Food in the Last Year: Never true  Transportation Needs: No Transportation Needs (04/11/2024)   PRAPARE - Administrator, Civil Service (Medical): No    Lack of Transportation (Non-Medical): No  Physical Activity: Insufficiently Active (04/11/2024)   Exercise Vital Sign    Days of Exercise per Week: 6 days    Minutes of Exercise per Session: 20 min  Stress: No Stress Concern Present (04/11/2024)   [employer] of Occupational Health - Occupational Stress Questionnaire    Feeling of Stress: Only a little  Social Connections: Moderately Isolated (04/11/2024)   Social Connection and Isolation Panel    Frequency of Communication with Friends and Family: More than three times a week    Frequency of Social Gatherings with Friends and Family: Once a week    Attends Religious Services: Never    Database administrator or Organizations: No    Attends Engineer, structural: Not on file    Marital Status: Married  Intimate Partner Violence: Unknown (10/29/2021)   Received from [facility]   HITS    Physically Hurt: Not on file    Insult or Talk Down To: Not on file    Threaten Physical Harm: Not on file    Scream or Curse: Not on file    ROS      Objective   BP 112/72 (BP Location: Left Arm, Patient Position: Sitting, Cuff Size: Large)   Pulse 73   Temp 97.9 F (36.6 C) (Oral)   Resp 18   Ht 5' 6 (1.676 m)   Wt  223 lb (101.2 kg)   SpO2 95%   BMI 35.99 kg/m    Physical Exam Vitals and nursing note reviewed.  Constitutional:      Appearance: Normal appearance.  HENT:     Head: Normocephalic and atraumatic.  Eyes:     Conjunctiva/sclera: Conjunctivae normal.  Cardiovascular:     Rate and Rhythm: Normal rate and regular rhythm.  Pulmonary:     Effort: Pulmonary effort is normal.     Breath sounds: Normal breath sounds.  Musculoskeletal:     Right lower leg: No edema.     Left lower leg: No edema.  Skin:    General: Skin is warm and dry.  Neurological:     Mental Status: She is alert and oriented to person, place, and time.  Psychiatric:        Mood and Affect: Mood normal.        Behavior: Behavior normal.        Thought Content: Thought content normal.        Judgment: Judgment normal.            The 10-year ASCVD risk score (Arnett DK, et al., 2019) is: 3.8%     Assessment & Plan:  Dysuria Assessment & Plan: Urinalysis was normal except specific gravity was 1.030.  Advised that she does not have a bladder infection but she probably has irritation from the concentrated urine.  Encouraged her to push fluids.  Orders: -     POCT URINALYSIS DIP (CLINITEK)  Need for influenza vaccination -     Flu vaccine HIGH DOSE PF(Fluzone Trivalent)  Assessment and Plan     Dehydration Recent episode of dehydration likely secondary to a gastrointestinal illness with significant diarrhea. Currently improving hydration status but still not optimal. - Increase water intake to at least 32 ounces per day to improve hydration.  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 Recent generator exchange performed about a month ago. Pacemaker has no upper limit, allowing for appropriate heart rate response during activity.  Osteoarthritis of left knee Chronic osteoarthritis of the left knee with  bone-on-bone contact. She has opted for conservative management including weight loss, exercise, and dietary changes to reduce inflammation. Pain management with ibuprofen as needed, but advised to limit use due to potential kidney effects. Topical treatments suggested as a safer alternative. - Continue conservative management with weight loss and exercise. - Use ibuprofen 600 mg every 6 hours as needed for pain, ensuring to eat with it and limit duration to 2-3 days. - Consider topical analgesics as a first-line treatment for pain management.  Suspected B12 deficiency Suspected B12 deficiency noted in chart, but she is unaware. Potential absorption issue discussed. Importance of addressing B12 deficiency due to its potential to mimic dementia-like symptoms. - Check B12 levels to confirm deficiency. - Consider oral B12 supplementation with 5000 mcg daily if deficiency is confirmed.  Adjustment disorder with mixed anxiety and depressed mood Experiencing adjustment disorder with mixed anxiety and depressed mood due to life stressors. Noted difficulty in handling stress as she ages. Discussed holistic approaches and potential use of St. John's Wort or SSRIs for mood improvement. - Consider St. John's Wort as a natural supplement for mood improvement, ensuring to choose a reputable brand. - Discuss potential use of SSRIs if symptoms persist or worsen.  Low libido secondary to menopause Low libido attributed to menopause and loss of estrogen. Discussed the natural decline in libido post-menopause and the lack of a 'magic drug' to address this. - Encourage open communication with partner regarding libido changes.  Recurrent hordeolum (stye) Recurrent hordeolum noted, with an upcoming appointment with an ophthalmologist for further evaluation. - Proceed with ophthalmology appointment in October for evaluation of recurrent hordeolum.              Return in about 3 months (around 07/12/2024).    Cordale Manera K Aliyha Fornes, MD

## 2024-05-11 ENCOUNTER — Ambulatory Visit (INDEPENDENT_AMBULATORY_CARE_PROVIDER_SITE_OTHER): Payer: BC Managed Care – PPO

## 2024-05-11 DIAGNOSIS — I495 Sick sinus syndrome: Secondary | ICD-10-CM

## 2024-05-14 LAB — CUP PACEART REMOTE DEVICE CHECK
Battery Remaining Longevity: 181 mo
Battery Voltage: 3.22 V
Brady Statistic AP VP Percent: 0.05 %
Brady Statistic AP VS Percent: 7.87 %
Brady Statistic AS VP Percent: 0.1 %
Brady Statistic AS VS Percent: 91.99 %
Brady Statistic RA Percent Paced: 7.98 %
Brady Statistic RV Percent Paced: 0.15 %
Date Time Interrogation Session: 20251015210727
Implantable Lead Connection Status: 753985
Implantable Lead Connection Status: 753985
Implantable Lead Implant Date: 20130313
Implantable Lead Implant Date: 20130313
Implantable Lead Location: 753859
Implantable Lead Location: 753860
Implantable Lead Model: 5092
Implantable Lead Model: 5594
Implantable Pulse Generator Implant Date: 20250805
Lead Channel Impedance Value: 266 Ohm
Lead Channel Impedance Value: 304 Ohm
Lead Channel Impedance Value: 380 Ohm
Lead Channel Impedance Value: 418 Ohm
Lead Channel Pacing Threshold Amplitude: 0.375 V
Lead Channel Pacing Threshold Amplitude: 1.5 V
Lead Channel Pacing Threshold Pulse Width: 0.4 ms
Lead Channel Pacing Threshold Pulse Width: 0.4 ms
Lead Channel Sensing Intrinsic Amplitude: 0.625 mV
Lead Channel Sensing Intrinsic Amplitude: 0.625 mV
Lead Channel Sensing Intrinsic Amplitude: 8.75 mV
Lead Channel Sensing Intrinsic Amplitude: 8.75 mV
Lead Channel Setting Pacing Amplitude: 1.5 V
Lead Channel Setting Pacing Amplitude: 3.25 V
Lead Channel Setting Pacing Pulse Width: 0.4 ms
Lead Channel Setting Sensing Sensitivity: 2.8 mV
Zone Setting Status: 755011
Zone Setting Status: 755011

## 2024-05-15 ENCOUNTER — Ambulatory Visit: Payer: Self-pay | Admitting: Cardiology

## 2024-05-18 NOTE — Progress Notes (Signed)
 Remote PPM Transmission

## 2024-05-19 DIAGNOSIS — H0014 Chalazion left upper eyelid: Secondary | ICD-10-CM | POA: Diagnosis not present

## 2024-06-12 NOTE — Progress Notes (Unsigned)
  Electrophysiology Office Follow up Visit Note:    Date:  06/14/2024   ID:  Erica Cline, DOB 1961-01-22, MRN [id number]  PCP:  Ziglar, Susan K, MD  [facility] Cardiologist:  None  [facility] Electrophysiologist:  OLE ONEIDA HOLTS, MD    Interval History:     Erica Cline is a 63 y.o. female who presents for a follow up visit.   She had a generator change February 29, 2024.  She is doing well since that time. She is doing well today.  She does report more noticeable shortness of breath with minimal exertion.  No chest discomfort but the symptoms have started to worry her.  She is concerned about the possibility of coronary artery disease.       Past medical, surgical, social and family history were reviewed.  ROS:   Please see the history of present illness.    All other systems reviewed and are negative.  EKGs/Labs/Other Studies Reviewed:    The following studies were reviewed today:  November 19th 2025 in-clinic device interrogation personally reviewed Battery and lead parameter stable        Physical Exam:    VS:  BP 118/82   Pulse 81   Ht 5' 6 (1.676 m)   Wt 223 lb 6.4 oz (101.3 kg)   SpO2 98%   BMI 36.06 kg/m     Wt Readings from Last 3 Encounters:  06/14/24 223 lb 6.4 oz (101.3 kg)  04/12/24 223 lb (101.2 kg)  04/10/24 220 lb (99.8 kg)     GEN: no distress CARD: RRR, No MRG.  Generator pocket well-healed RESP: No IWOB. CTAB.      ASSESSMENT:    1. SSS (sick sinus syndrome) (HCC)   2. Mobitz type 2 second degree heart block   3. Pacemaker    PLAN:    In order of problems listed above:  #Exertional shortness of breath Concerning for anginal equivalent.  I have recommended an echocardiogram and coronary CTA to further evaluate.  #Sick sinus syndrome #Mobitz 2 AV block #Permanent pacemaker in situ Doing well after generator replacement  I discussed my upcoming departure from [facility] during today's clinic appointment.   She will follow-up with my partners moving forward.  Follow-up 1 years with EP APP   Signed, Ole Holts, MD, [facility], [facility] 06/14/2024 8:45 AM    Electrophysiology [facility]

## 2024-06-14 ENCOUNTER — Encounter: Payer: Self-pay | Admitting: Cardiology

## 2024-06-14 ENCOUNTER — Ambulatory Visit: Attending: Cardiology | Admitting: Cardiology

## 2024-06-14 ENCOUNTER — Encounter: Admitting: Cardiology

## 2024-06-14 VITALS — BP 118/82 | HR 81 | Ht 66.0 in | Wt 223.4 lb

## 2024-06-14 DIAGNOSIS — R072 Precordial pain: Secondary | ICD-10-CM

## 2024-06-14 DIAGNOSIS — I441 Atrioventricular block, second degree: Secondary | ICD-10-CM | POA: Diagnosis not present

## 2024-06-14 DIAGNOSIS — I495 Sick sinus syndrome: Secondary | ICD-10-CM | POA: Diagnosis not present

## 2024-06-14 DIAGNOSIS — Z950 Presence of cardiac pacemaker: Secondary | ICD-10-CM

## 2024-06-14 LAB — CUP PACEART INCLINIC DEVICE CHECK
Date Time Interrogation Session: 20251119092450
Implantable Lead Connection Status: 753985
Implantable Lead Connection Status: 753985
Implantable Lead Implant Date: 20130313
Implantable Lead Implant Date: 20130313
Implantable Lead Location: 753859
Implantable Lead Location: 753860
Implantable Lead Model: 5092
Implantable Lead Model: 5594
Implantable Pulse Generator Implant Date: 20250805
Lead Channel Impedance Value: 361 Ohm
Lead Channel Impedance Value: 456 Ohm
Lead Channel Pacing Threshold Amplitude: 0.3 V
Lead Channel Pacing Threshold Amplitude: 1.5 V
Lead Channel Pacing Threshold Pulse Width: 0.4 ms
Lead Channel Pacing Threshold Pulse Width: 0.4 ms
Lead Channel Sensing Intrinsic Amplitude: 0.8 mV
Lead Channel Sensing Intrinsic Amplitude: 10.6 mV

## 2024-06-14 NOTE — Patient Instructions (Signed)
 Medication Instructions:  Your physician recommends that you continue on your current medications as directed. Please refer to the Current Medication list given to you today.  *If you need a refill on your cardiac medications before your next appointment, please call your pharmacy*  Testing/Procedures: Echocardiogram  Your physician has requested that you have an echocardiogram. Echocardiography is a painless test that uses sound waves to create images of your heart. It provides your doctor with information about the size and shape of your heart and how well your heart's chambers and valves are working. This procedure takes approximately one hour. There are no restrictions for this procedure. Please do NOT wear cologne, perfume, aftershave, or lotions (deodorant is allowed). Please arrive 15 minutes prior to your appointment time.  Please note: We ask at that you not bring children with you during ultrasound (echo/ vascular) testing. Due to room size and safety concerns, children are not allowed in the ultrasound rooms during exams. Our front office staff cannot provide observation of children in our lobby area while testing is being conducted. An adult accompanying a patient to their appointment will only be allowed in the ultrasound room at the discretion of the ultrasound technician under special circumstances. We apologize for any inconvenience.  Cardiac CT Your physician has requested that you have cardiac CT. Cardiac computed tomography (CT) is a painless test that uses an x-ray machine to take clear, detailed pictures of your heart. For further information please visit [email or web address] Please follow instruction sheet as given. You will be called to schedule this test.   Follow-Up: At [facility], you and your health needs are our priority.  As part of our continuing mission to provide you with exceptional heart care, our providers are all part of one team.  This team includes  your primary Cardiologist (physician) and Advanced Practice Providers or APPs (Physician Assistants and Nurse Practitioners) who all work together to provide you with the care you need, when you need it.  Your next appointment:   1 year  Provider:   Suzann Riddle, NP

## 2024-06-15 ENCOUNTER — Ambulatory Visit: Payer: Self-pay | Admitting: Cardiology

## 2024-06-15 DIAGNOSIS — H2513 Age-related nuclear cataract, bilateral: Secondary | ICD-10-CM | POA: Diagnosis not present

## 2024-06-15 DIAGNOSIS — H43813 Vitreous degeneration, bilateral: Secondary | ICD-10-CM | POA: Diagnosis not present

## 2024-06-15 DIAGNOSIS — H0014 Chalazion left upper eyelid: Secondary | ICD-10-CM | POA: Diagnosis not present

## 2024-06-15 DIAGNOSIS — H35372 Puckering of macula, left eye: Secondary | ICD-10-CM | POA: Diagnosis not present

## 2024-06-29 DIAGNOSIS — L603 Nail dystrophy: Secondary | ICD-10-CM | POA: Diagnosis not present

## 2024-06-29 DIAGNOSIS — L821 Other seborrheic keratosis: Secondary | ICD-10-CM | POA: Diagnosis not present

## 2024-07-05 ENCOUNTER — Other Ambulatory Visit: Payer: Self-pay | Admitting: Medical Genetics

## 2024-07-11 ENCOUNTER — Other Ambulatory Visit: Payer: Self-pay | Admitting: Student in an Organized Health Care Education/Training Program

## 2024-07-11 DIAGNOSIS — G8929 Other chronic pain: Secondary | ICD-10-CM

## 2024-07-11 DIAGNOSIS — M5416 Radiculopathy, lumbar region: Secondary | ICD-10-CM

## 2024-07-12 ENCOUNTER — Ambulatory Visit: Admitting: Family Medicine

## 2024-07-12 ENCOUNTER — Encounter: Payer: Self-pay | Admitting: Family Medicine

## 2024-07-12 VITALS — BP 104/74 | HR 74 | Temp 98.0°F | Ht 66.0 in | Wt 223.5 lb

## 2024-07-12 DIAGNOSIS — G629 Polyneuropathy, unspecified: Secondary | ICD-10-CM

## 2024-07-12 DIAGNOSIS — I495 Sick sinus syndrome: Secondary | ICD-10-CM

## 2024-07-12 DIAGNOSIS — M8580 Other specified disorders of bone density and structure, unspecified site: Secondary | ICD-10-CM | POA: Diagnosis not present

## 2024-07-12 DIAGNOSIS — E782 Mixed hyperlipidemia: Secondary | ICD-10-CM | POA: Diagnosis not present

## 2024-07-12 DIAGNOSIS — E538 Deficiency of other specified B group vitamins: Secondary | ICD-10-CM

## 2024-07-12 DIAGNOSIS — I441 Atrioventricular block, second degree: Secondary | ICD-10-CM

## 2024-07-12 DIAGNOSIS — E559 Vitamin D deficiency, unspecified: Secondary | ICD-10-CM

## 2024-07-12 DIAGNOSIS — R5383 Other fatigue: Secondary | ICD-10-CM | POA: Diagnosis not present

## 2024-07-12 MED ORDER — GABAPENTIN 400 MG PO CAPS: 400.0000 mg | ORAL_CAPSULE | Freq: Every day | ORAL | 1 refills | Status: AC

## 2024-07-12 NOTE — Assessment & Plan Note (Signed)
 Dr. Cindie has left the practice.  Will send her to [facility] for electrophysiology.

## 2024-07-12 NOTE — Progress Notes (Signed)
 Established Patient Office Visit  Subjective   Patient ID: Erica Cline, female    DOB: 13-Nov-1960  Age: 63 y.o. MRN: [id number]  Chief Complaint  Patient presents with   Follow-up    HPI Erica Cline 63 year old with Mobitz type II second-degree heart block and dual-chamber PPM, osteoarthritis of both knees, occasional low mood.   Discussed the use of AI scribe software for clinical note transcription with the patient, who gave verbal consent to proceed.  History of Present Illness    Her pacemaker was recently interrogated. There is no upper limit on her heart rate to keep her from getting SOB with exercise.    She experiences intermittent knee pain and has purchased a Peloton treadmill to aid in weight loss, believing that losing weight will help alleviate her knee pain. She uses the treadmill daily and also rides a bike outdoors when weather permits. She takes ibuprofen occasionally, less than once a month, and notes that increased exercise has reduced her pain. She takes gabapentin  600 mg at night for knee pain and reports some daytime grogginess. She has received steroid injections in her knee and back in the past but is cautious about frequent use.  She experiences occasional locking of her thumb, which she attributes to prolonged computer use. She describes it as a cramp that resolves with manual manipulation.  She has been experiencing mood fluctuations, which she attributes to her high-achieving nature and work stress. She has increased her exercise to improve her mood and has considered medication like Lexapro but prefers to manage without pills due to concerns about side effects.  She is seeing an ophthalmologist for an issue with her left eye, for which she is using an ointment and warm compresses. The condition has improved, but a biopsy might be necessary if it does not resolve.  She reports difficulty sleeping, often waking up at 2 or 3 AM but is able to return to sleep  after about 30 minutes. She attributes this to anxiety or depression.  No swelling in legs. Reports difficulty sleeping and occasional grogginess during the day.     ROS    Objective:     BP 104/74   Pulse 74   Temp 98 F (36.7 C) (Oral)   Ht 5' 6 (1.676 m)   Wt 223 lb 8 oz (101.4 kg)   SpO2 96%   BMI 36.07 kg/m    Physical Exam Vitals and nursing note reviewed.  Constitutional:      Appearance: Normal appearance.  HENT:     Head: Normocephalic and atraumatic.  Eyes:     Conjunctiva/sclera: Conjunctivae normal.  Cardiovascular:     Rate and Rhythm: Normal rate and regular rhythm.  Pulmonary:     Effort: Pulmonary effort is normal.     Breath sounds: Normal breath sounds.  Musculoskeletal:     Right lower leg: No edema.     Left lower leg: No edema.  Skin:    General: Skin is warm and dry.  Neurological:     Mental Status: She is alert and oriented to person, place, and time.  Psychiatric:        Mood and Affect: Mood normal.        Behavior: Behavior normal.        Thought Content: Thought content normal.        Judgment: Judgment normal.          No results found for any visits on 07/12/24.  The 10-year ASCVD risk score (Arnett DK, et al., 2019) is: 3.3%    Assessment & Plan:  Vitamin D  deficiency -     VITAMIN D  25 Hydroxy (Vit-D Deficiency, Fractures)  Vitamin B12 deficiency -     Vitamin B12  Mixed hyperlipidemia -     Comprehensive metabolic panel with GFR -     Lipid panel  Sick sinus syndrome (HCC) -     TSH -     T4, free -     Ambulatory referral to Cardiology  Other fatigue -     CBC with Differential/Platelet  Osteopenia, unspecified location -     DG Bone Density; Future  Neuropathy -     Gabapentin ; Take 1 capsule (400 mg total) by mouth at bedtime.  Dispense: 90 capsule; Refill: 1  Mobitz type 2 second degree heart block Assessment & Plan: Dr. Cindie has left the practice.  Will send her to [facility] for  electrophysiology.      Return in about 6 months (around 01/10/2025).    Erica Cline K Cynia Abruzzo, MD

## 2024-07-13 ENCOUNTER — Other Ambulatory Visit: Payer: Self-pay

## 2024-07-13 ENCOUNTER — Telehealth: Payer: Self-pay

## 2024-07-13 LAB — COMPREHENSIVE METABOLIC PANEL WITH GFR
ALT: 15 IU/L (ref 0–32)
AST: 19 IU/L (ref 0–40)
Albumin: 4.1 g/dL (ref 3.9–4.9)
Alkaline Phosphatase: 94 IU/L (ref 49–135)
BUN/Creatinine Ratio: 26 (ref 12–28)
BUN: 17 mg/dL (ref 8–27)
Bilirubin Total: 1.1 mg/dL (ref 0.0–1.2)
CO2: 24 mmol/L (ref 20–29)
Calcium: 10.2 mg/dL (ref 8.7–10.3)
Chloride: 105 mmol/L (ref 96–106)
Creatinine, Ser: 0.66 mg/dL (ref 0.57–1.00)
Globulin, Total: 2.1 g/dL (ref 1.5–4.5)
Glucose: 83 mg/dL (ref 70–99)
Potassium: 4.4 mmol/L (ref 3.5–5.2)
Sodium: 142 mmol/L (ref 134–144)
Total Protein: 6.2 g/dL (ref 6.0–8.5)
eGFR: 98 mL/min/1.73 (ref >59)

## 2024-07-13 LAB — VITAMIN B12: Vitamin B-12: 426 pg/mL (ref 232–1245)

## 2024-07-13 LAB — CBC WITH DIFFERENTIAL/PLATELET
Basophils Absolute: 0 x10E3/uL (ref 0.0–0.2)
Basos: 1 %
EOS (ABSOLUTE): 0.1 x10E3/uL (ref 0.0–0.4)
Eos: 2 %
Hematocrit: 42.3 % (ref 34.0–46.6)
Hemoglobin: 13.7 g/dL (ref 11.1–15.9)
Immature Grans (Abs): 0 x10E3/uL (ref 0.0–0.1)
Immature Granulocytes: 0 %
Lymphocytes Absolute: 1.4 x10E3/uL (ref 0.7–3.1)
Lymphs: 31 %
MCH: 29 pg (ref 26.6–33.0)
MCHC: 32.4 g/dL (ref 31.5–35.7)
MCV: 89 fL (ref 79–97)
Monocytes Absolute: 0.6 x10E3/uL (ref 0.1–0.9)
Monocytes: 12 %
Neutrophils Absolute: 2.5 x10E3/uL (ref 1.4–7.0)
Neutrophils: 54 %
Platelets: 280 x10E3/uL (ref 150–450)
RBC: 4.73 x10E6/uL (ref 3.77–5.28)
RDW: 12 % (ref 11.7–15.4)
WBC: 4.7 x10E3/uL (ref 3.4–10.8)

## 2024-07-13 LAB — LIPID PANEL
Chol/HDL Ratio: 3.5 ratio (ref 0.0–4.4)
Cholesterol, Total: 222 mg/dL — ABNORMAL HIGH (ref 100–199)
HDL: 64 mg/dL (ref >39)
LDL Chol Calc (NIH): 145 mg/dL — ABNORMAL HIGH (ref 0–99)
Triglycerides: 76 mg/dL (ref 0–149)
VLDL Cholesterol Cal: 13 mg/dL (ref 5–40)

## 2024-07-13 LAB — VITAMIN D 25 HYDROXY (VIT D DEFICIENCY, FRACTURES): Vit D, 25-Hydroxy: 25.1 ng/mL — ABNORMAL LOW (ref 30.0–100.0)

## 2024-07-13 LAB — TSH: TSH: 1.73 u[IU]/mL (ref 0.450–4.500)

## 2024-07-13 MED ORDER — METOPROLOL TARTRATE 100 MG PO TABS: 100.0000 mg | ORAL_TABLET | Freq: Once | ORAL | 0 refills | Status: AC

## 2024-07-13 MED ORDER — IVABRADINE HCL 5 MG PO TABS: 10.0000 mg | ORAL_TABLET | Freq: Once | ORAL | 0 refills | Status: AC

## 2024-07-13 NOTE — Addendum Note (Signed)
 Addended by: CHAUVIGNE, Ane Conerly on: 07/13/2024 03:22 PM   Modules accepted: Orders

## 2024-07-13 NOTE — Telephone Encounter (Signed)
 Copied from CRM #[id number]. Topic: Referral - Question >> Jul 13, 2024 10:53 AM Shanda MATSU wrote: Reason for CRM: Patient calling in regards to referral for patient to see Dr. Fonda [facility] being sent incorrectly, patient stated that Dr. Shaune office adv her that referral was sent as a cardiologist referral when Dr. Kitty is not a cardiologist and is an electrophysiology provider so referral needs to be sent as the specialty listed as electrophysiology, not cardiology, patient is req that the referral be corrected and sent again.

## 2024-07-14 ENCOUNTER — Telehealth (HOSPITAL_COMMUNITY): Payer: Self-pay | Admitting: *Deleted

## 2024-07-14 NOTE — Telephone Encounter (Signed)
 Reaching out to patient to offer assistance regarding upcoming cardiac imaging study; pt verbalizes understanding of appt date/time, parking situation and where to check in, pre-test NPO status and medications ordered, and verified current allergies; name and call back number provided for further questions should they arise Sid Seats RN Navigator Cardiac Imaging [facility] [phone] office [phone] cell

## 2024-07-14 NOTE — Addendum Note (Signed)
 Addended by: Frederick Klinger K on: 07/14/2024 01:46 PM   Modules accepted: Orders

## 2024-07-17 ENCOUNTER — Ambulatory Visit
Admission: RE | Admit: 2024-07-17 | Discharge: 2024-07-17 | Disposition: A | Discharge status: Home / Self Care | Source: Ambulatory Visit | Attending: Cardiology | Admitting: Cardiology

## 2024-07-17 DIAGNOSIS — R072 Precordial pain: Secondary | ICD-10-CM

## 2024-07-17 MED ORDER — IOHEXOL 350 MG/ML SOLN
100.0000 mL | Freq: Once | INTRAVENOUS | Status: AC | PRN
  Administered 2024-07-17: 100 mL via INTRAVENOUS

## 2024-07-17 MED ORDER — NITROGLYCERIN 0.4 MG SL SUBL
0.8000 mg | SUBLINGUAL_TABLET | Freq: Once | SUBLINGUAL | Status: AC
  Administered 2024-07-17: 0.8 mg via SUBLINGUAL
  Filled 2024-07-17: qty 25

## 2024-07-17 MED ORDER — DILTIAZEM HCL 25 MG/5ML IV SOLN: 10.0000 mg | INTRAVENOUS | Status: DC | PRN

## 2024-07-17 MED ORDER — METOPROLOL TARTRATE 5 MG/5ML IV SOLN: 10.0000 mg | INTRAVENOUS | Status: DC | PRN

## 2024-07-18 ENCOUNTER — Ambulatory Visit: Payer: Self-pay | Admitting: Family Medicine

## 2024-07-18 ENCOUNTER — Ambulatory Visit: Attending: Cardiology

## 2024-07-18 DIAGNOSIS — I441 Atrioventricular block, second degree: Secondary | ICD-10-CM

## 2024-07-18 DIAGNOSIS — I495 Sick sinus syndrome: Secondary | ICD-10-CM | POA: Diagnosis not present

## 2024-07-18 DIAGNOSIS — R072 Precordial pain: Secondary | ICD-10-CM | POA: Diagnosis not present

## 2024-07-18 DIAGNOSIS — Z950 Presence of cardiac pacemaker: Secondary | ICD-10-CM

## 2024-07-18 LAB — ECHOCARDIOGRAM COMPLETE
AR max vel: 2.35 cm2
AV Area VTI: 2.36 cm2
AV Area mean vel: 2.34 cm2
AV Mean grad: 2 mmHg
AV Peak grad: 4.2 mmHg
Ao pk vel: 1.03 m/s
Area-P 1/2: 3.99 cm2
Calc EF: 47.7 %
S' Lateral: 3.4 cm
Single Plane A2C EF: 47.7 %
Single Plane A4C EF: 46.5 %

## 2024-07-24 ENCOUNTER — Encounter: Payer: Self-pay | Admitting: Family Medicine

## 2024-08-10 ENCOUNTER — Ambulatory Visit

## 2024-08-10 DIAGNOSIS — I495 Sick sinus syndrome: Secondary | ICD-10-CM

## 2024-08-10 LAB — CUP PACEART REMOTE DEVICE CHECK
Battery Remaining Longevity: 178 mo
Battery Voltage: 3.21 V
Brady Statistic AP VP Percent: 0.06 %
Brady Statistic AP VS Percent: 4.99 %
Brady Statistic AS VP Percent: 0.13 %
Brady Statistic AS VS Percent: 94.82 %
Brady Statistic RA Percent Paced: 5.08 %
Brady Statistic RV Percent Paced: 0.19 %
Date Time Interrogation Session: 20260114211816
Implantable Lead Connection Status: 753985
Implantable Lead Connection Status: 753985
Implantable Lead Implant Date: 20130313
Implantable Lead Implant Date: 20130313
Implantable Lead Location: 753859
Implantable Lead Location: 753860
Implantable Lead Model: 5092
Implantable Lead Model: 5594
Implantable Pulse Generator Implant Date: 20250805
Lead Channel Impedance Value: 285 Ohm
Lead Channel Impedance Value: 342 Ohm
Lead Channel Impedance Value: 399 Ohm
Lead Channel Impedance Value: 418 Ohm
Lead Channel Pacing Threshold Amplitude: 0.375 V
Lead Channel Pacing Threshold Amplitude: 1.375 V
Lead Channel Pacing Threshold Pulse Width: 0.4 ms
Lead Channel Pacing Threshold Pulse Width: 0.4 ms
Lead Channel Sensing Intrinsic Amplitude: 0.5 mV
Lead Channel Sensing Intrinsic Amplitude: 0.5 mV
Lead Channel Sensing Intrinsic Amplitude: 9.875 mV
Lead Channel Sensing Intrinsic Amplitude: 9.875 mV
Lead Channel Setting Pacing Amplitude: 1.5 V
Lead Channel Setting Pacing Amplitude: 2.75 V
Lead Channel Setting Pacing Pulse Width: 0.4 ms
Lead Channel Setting Sensing Sensitivity: 2.8 mV
Zone Setting Status: 755011
Zone Setting Status: 755011

## 2024-08-13 ENCOUNTER — Ambulatory Visit: Payer: Self-pay | Admitting: Cardiology

## 2024-08-18 NOTE — Progress Notes (Signed)
 Remote PPM Transmission

## 2024-08-24 ENCOUNTER — Other Ambulatory Visit

## 2024-08-30 ENCOUNTER — Other Ambulatory Visit: Payer: Self-pay | Admitting: Medical Genetics

## 2024-08-30 DIAGNOSIS — Z006 Encounter for examination for normal comparison and control in clinical research program: Secondary | ICD-10-CM

## 2024-11-09 ENCOUNTER — Ambulatory Visit

## 2025-01-10 ENCOUNTER — Encounter: Admitting: Family Medicine

## 2025-02-08 ENCOUNTER — Ambulatory Visit

## 2025-05-10 ENCOUNTER — Ambulatory Visit

## 2025-08-09 ENCOUNTER — Ambulatory Visit
